# Patient Record
Sex: Female | Born: 1950 | Race: White | Hispanic: No | Marital: Married | Smoking: Never smoker
Health system: Southern US, Community
[De-identification: ages and names within clinical notes are randomized; demographics above are authoritative.]

## PROBLEM LIST (undated history)

## (undated) DIAGNOSIS — K219 Gastro-esophageal reflux disease without esophagitis: Secondary | ICD-10-CM

## (undated) DIAGNOSIS — E119 Type 2 diabetes mellitus without complications: Secondary | ICD-10-CM

## (undated) DIAGNOSIS — M792 Neuralgia and neuritis, unspecified: Secondary | ICD-10-CM

## (undated) DIAGNOSIS — E785 Hyperlipidemia, unspecified: Secondary | ICD-10-CM

## (undated) DIAGNOSIS — Z8719 Personal history of other diseases of the digestive system: Secondary | ICD-10-CM

## (undated) DIAGNOSIS — B029 Zoster without complications: Secondary | ICD-10-CM

## (undated) DIAGNOSIS — I10 Essential (primary) hypertension: Secondary | ICD-10-CM

## (undated) DIAGNOSIS — R0600 Dyspnea, unspecified: Secondary | ICD-10-CM

## (undated) DIAGNOSIS — M810 Age-related osteoporosis without current pathological fracture: Secondary | ICD-10-CM

## (undated) DIAGNOSIS — Z780 Asymptomatic menopausal state: Secondary | ICD-10-CM

## (undated) DIAGNOSIS — J309 Allergic rhinitis, unspecified: Secondary | ICD-10-CM

## (undated) HISTORY — DX: Asymptomatic menopausal state: Z78.0

## (undated) HISTORY — DX: Age-related osteoporosis without current pathological fracture: M81.0

## (undated) HISTORY — PX: TUBAL LIGATION: SHX77

## (undated) HISTORY — DX: Allergic rhinitis, unspecified: J30.9

## (undated) HISTORY — DX: Neuralgia and neuritis, unspecified: M79.2

## (undated) HISTORY — DX: Zoster without complications: B02.9

## (undated) HISTORY — DX: Gastro-esophageal reflux disease without esophagitis: K21.9

## (undated) HISTORY — DX: Hyperlipidemia, unspecified: E78.5

## (undated) HISTORY — PX: CHOLECYSTECTOMY: SHX55

---

## 2006-06-23 ENCOUNTER — Ambulatory Visit: Payer: Self-pay | Admitting: Unknown Physician Specialty

## 2008-10-19 ENCOUNTER — Ambulatory Visit: Payer: Self-pay | Admitting: Family Medicine

## 2009-10-16 ENCOUNTER — Ambulatory Visit: Payer: Self-pay | Admitting: Internal Medicine

## 2009-10-24 ENCOUNTER — Ambulatory Visit: Payer: Self-pay | Admitting: Internal Medicine

## 2009-11-24 ENCOUNTER — Ambulatory Visit: Payer: Self-pay | Admitting: General Surgery

## 2010-11-07 ENCOUNTER — Ambulatory Visit: Payer: Self-pay | Admitting: Family Medicine

## 2011-11-13 ENCOUNTER — Ambulatory Visit: Payer: Self-pay | Admitting: Internal Medicine

## 2011-11-19 LAB — PULMONARY FUNCTION TEST

## 2011-11-21 ENCOUNTER — Ambulatory Visit: Payer: Self-pay | Admitting: Family Medicine

## 2012-02-17 ENCOUNTER — Ambulatory Visit (INDEPENDENT_AMBULATORY_CARE_PROVIDER_SITE_OTHER): Payer: PRIVATE HEALTH INSURANCE | Admitting: Internal Medicine

## 2012-02-17 ENCOUNTER — Encounter: Payer: Self-pay | Admitting: Internal Medicine

## 2012-02-17 ENCOUNTER — Institutional Professional Consult (permissible substitution): Payer: Self-pay | Admitting: Internal Medicine

## 2012-02-17 VITALS — BP 130/86 | HR 87 | Temp 98.2°F | Ht 63.0 in | Wt 159.6 lb

## 2012-02-17 DIAGNOSIS — R0989 Other specified symptoms and signs involving the circulatory and respiratory systems: Secondary | ICD-10-CM

## 2012-02-17 DIAGNOSIS — J45909 Unspecified asthma, uncomplicated: Secondary | ICD-10-CM

## 2012-02-17 DIAGNOSIS — R05 Cough: Secondary | ICD-10-CM

## 2012-02-17 DIAGNOSIS — J449 Chronic obstructive pulmonary disease, unspecified: Secondary | ICD-10-CM

## 2012-02-17 DIAGNOSIS — R0600 Dyspnea, unspecified: Secondary | ICD-10-CM

## 2012-02-17 MED ORDER — BUDESONIDE-FORMOTEROL FUMARATE 160-4.5 MCG/ACT IN AERO: INHALATION_SPRAY | RESPIRATORY_TRACT | Status: DC

## 2012-02-17 MED ORDER — MONTELUKAST SODIUM 10 MG PO TABS: 10.0000 mg | ORAL_TABLET | Freq: Every day | ORAL | Status: DC

## 2012-02-17 NOTE — Patient Instructions (Addendum)
Symbicort 160 Take 2 puffs first thing in am and then another 2 puffs about 12 hours later.   Zergerid change it to take before bfast and add pepcid 20 mg one at bedtime  GERD (REFLUX)  is an extremely common cause of respiratory symptoms (looks just asthma) , many times with no significant heartburn at all.    It can be treated with medication, but also with lifestyle changes including avoidance of late meals, excessive alcohol, smoking cessation, and avoid fatty foods, chocolate, peppermint, colas, red wine, and acidic juices such as orange juice.  NO MINT OR MENTHOL PRODUCTS SO NO COUGH DROPS  USE SUGARLESS CANDY INSTEAD (jolley ranchers or Stover's)  NO OIL BASED VITAMINS - use powdered substitutes.    Work on inhaler technique:  relax and gently blow all the way out then take a nice smooth deep breath back in, triggering the inhaler at same time you start breathing in.  Hold for up to 5 seconds if you can.  Rinse and gargle with water when done   If your mouth or throat starts to bother you,   I suggest you time the inhaler to your dental care and after using the inhaler(s) brush teeth and tongue with a baking soda containing toothpaste and when you rinse this out, gargle with it first to see if this helps your mouth and throat.     Singulair 10 mg one each pm until you return  Stop spiriva and only take xyzal if needed for itching sneezing running nose  Please schedule a follow up office visit in 6 weeks, call sooner if needed with pft'ssdf

## 2012-02-17 NOTE — Progress Notes (Signed)
Subjective:    Patient ID: Ruth Grant, female    DOB: 03-10-1951   MRN: [id number]  HPI  80 yowf never smokers exposed as child with whooping cough but fine until around 2003 with daily symptoms of nasal drainage  and cough dx'd as copd by Dr Meredeth Ide and referred to pulmonary clinic 02/17/2012 by a pulmonary pt here = Annamaria Helling = HR Sherrine Maples Raven/sunbrella).   02/17/2012 1st pulmonary eval cc daily cough x years seems worse in Nov and Dec and better p use symbicort and spiriva (started both at same time) - cough is dry hacking,  Only sob with inclines.  singulair helped a lot, then quit working, shots helped some then quit working. ? If nasal symptoms better xyzal. hfa technique marginal.   No   purulent sputum or sinus/hb symptoms on present rx.  Sleeping ok without nocturnal  or early am exacerbation  of respiratory  c/o's or need for noct saba. Also denies any obvious fluctuation of symptoms with weather or environmental changes or other aggravating or alleviating factors except as outlined above       Review of Systems  Constitutional: Negative for fever, chills and unexpected weight change.  HENT: Negative for ear pain, nosebleeds, congestion, sore throat, rhinorrhea, sneezing, trouble swallowing, dental problem, voice change, postnasal drip and sinus pressure.   Eyes: Negative for visual disturbance.  Respiratory: Positive for cough and shortness of breath. Negative for choking.   Cardiovascular: Negative for chest pain and leg swelling.  Gastrointestinal: Negative for vomiting, abdominal pain and diarrhea.  Genitourinary: Negative for difficulty urinating.  Musculoskeletal: Negative for arthralgias.  Skin: Negative for rash.  Neurological: Negative for tremors, syncope and headaches.  Hematological: Does not bruise/bleed easily.       Objective:   Physical Exam Pleasant amb wf nad 159 02/17/12 HEENT mild turbinate edema.  Oropharynx no thrush or excess pnd or  cobblestoning.  No JVD or cervical adenopathy. no accessory muscle hypertrophy. Trachea midline, nl thryroid. Chest was min hyperinflated by percussion with slt diminished breath sounds and mild increased exp time without wheeze. Hoover sign positive at very end inspiration. Regular rate and rhythm without murmur gallop or rub or increase P2 or edema.  Abd: no hsm, nl excursion. Ext warm without cyanosis or clubbing.         Assessment & Plan:   Outpatient Encounter Prescriptions as of 02/17/2012  Medication Sig Dispense Refill  . aspirin 81 MG tablet Take 81 mg by mouth daily.      . budesonide-formoterol (SYMBICORT) 160-4.5 MCG/ACT inhaler Take 2 puffs first thing in am and then another 2 puffs about 12 hours later.  1 Inhaler    . cholecalciferol (VITAMIN D) 1000 UNITS tablet Take 1,000 Units by mouth daily.      Marland Kitchen levocetirizine (XYZAL) 5 MG tablet Take 5 mg by mouth every evening.      Marland Kitchen omeprazole-sodium bicarbonate (ZEGERID) 40-1100 MG per capsule Take 30-60 min before first meal of the day      . raloxifene (EVISTA) 60 MG tablet Take 60 mg by mouth daily.      . sertraline (ZOLOFT) 50 MG tablet Take 50 mg by mouth daily.      . simvastatin (ZOCOR) 40 MG tablet Take 40 mg by mouth at bedtime.      Marland Kitchen DISCONTD: budesonide-formoterol (SYMBICORT) 160-4.5 MCG/ACT inhaler Inhale 2 puffs into the lungs daily.      Marland Kitchen DISCONTD: omeprazole (PRILOSEC) 20 MG capsule Take 20  mg by mouth daily. Delayed release      . DISCONTD: omeprazole-sodium bicarbonate (ZEGERID) 40-1100 MG per capsule Take 1 capsule by mouth at bedtime.      Marland Kitchen DISCONTD: tiotropium (SPIRIVA) 18 MCG inhalation capsule Place 18 mcg into inhaler and inhale daily.      . famotidine (PEPCID) 20 MG tablet One at bedtime      . montelukast (SINGULAIR) 10 MG tablet Take 1 tablet (10 mg total) by mouth at bedtime.  30 tablet  11

## 2012-02-18 DIAGNOSIS — R05 Cough: Secondary | ICD-10-CM | POA: Insufficient documentation

## 2012-02-18 MED ORDER — FAMOTIDINE 20 MG PO TABS: ORAL_TABLET | ORAL | Status: DC

## 2012-02-18 MED ORDER — OMEPRAZOLE-SODIUM BICARBONATE 40-1100 MG PO CAPS: ORAL_CAPSULE | ORAL | Status: DC

## 2012-02-18 NOTE — Assessment & Plan Note (Signed)
The most common causes of chronic cough in immunocompetent adults include the following: upper airway cough syndrome (UACS), previously referred to as postnasal drip syndrome (PNDS), which is caused by variety of rhinosinus conditions; (2) asthma; (3) GERD; (4) chronic bronchitis from cigarette smoking or other inhaled environmental irritants; (5) nonasthmatic eosinophilic bronchitis; and (6) bronchiectasis.   These conditions, singly or in combination, have accounted for up to 94% of the causes of chronic cough in prospective studies.   Other conditions have constituted no >6% of the causes in prospective studies These have included bronchogenic carcinoma, chronic interstitial pneumonia, sarcoidosis, left ventricular failure, ACEI-induced cough, and aspiration from a condition associated with pharyngeal dysfunction.   Not clear whether this cough is a pulmonary problem or  Classic Upper airway cough syndrome, so named because it's frequently impossible to sort out how much is  CR/sinusitis with freq throat clearing (which can be related to primary GERD)   vs  causing  secondary (" extra esophageal")  GERD from wide swings in gastric pressure that occur with throat clearing, often  promoting self use of mint and menthol lozenges that reduce the lower esophageal sphincter tone and exacerbate the problem further in a cyclical fashion.   These are the same pts (now being labeled as having "irritable larynx syndrome" by some cough centers) who not infrequently have a history of having failed to tolerate ace inhibitors,  dry powder inhalers or biphosphonates or report having atypical reflux symptoms that don't respond to standard doses of PPI , and are easily confused as having aecopd or asthma flares by even experienced allergists/ pulmonologists.   For now, max GERD RX with q am zegerid and qhs Pepcid then regroup.  Also try off spiriva which is DPI and can aggravate upper airway cough.

## 2012-02-18 NOTE — Assessment & Plan Note (Addendum)
-   HFA 75% p coaching 02/17/2012     -Alpha one AT screening MM per Dr Meredeth Ide  2013    - PFTs 11/19/11 FEV1 1.24 (53%) ratio 53 and 16% better p B2,  DLCO 56 > corrects to 112  Severe chronic asthma is not the same as copd and we may be able to offer a different (non-copd) approach to address her chronic asthma symtpoms more effectively.  However, I explained as clearly as I could: Unlike when you get a prescription for eyeglasses, it's not possible to always walk out of this or any medical office with a perfect prescription that is immediately effective  based on any test that we offer here.    On the contrary, it may take several weeks for the full impact of changes recommened today - hopefully you will respond well.  If not, then we'll adjust your medication on your next visit accordingly, knowing more then than we can possibly know now.      For now try maximize hfa technique, rechallenge with singulair and regroup with full repeat set of pfts  The proper method of use, as well as anticipated side effects, of a metered-dose inhaler are discussed and demonstrated to the patient. Improved effectiveness after extensive coaching during this visit to a level of approximately  75%

## 2012-03-31 ENCOUNTER — Ambulatory Visit: Payer: PRIVATE HEALTH INSURANCE | Admitting: Internal Medicine

## 2012-04-15 ENCOUNTER — Encounter (INDEPENDENT_AMBULATORY_CARE_PROVIDER_SITE_OTHER): Payer: PRIVATE HEALTH INSURANCE

## 2012-04-15 ENCOUNTER — Encounter: Payer: Self-pay | Admitting: Internal Medicine

## 2012-04-15 ENCOUNTER — Ambulatory Visit (INDEPENDENT_AMBULATORY_CARE_PROVIDER_SITE_OTHER): Payer: PRIVATE HEALTH INSURANCE | Admitting: Internal Medicine

## 2012-04-15 VITALS — BP 122/78 | HR 92 | Temp 98.6°F | Ht 63.0 in | Wt 169.0 lb

## 2012-04-15 DIAGNOSIS — J45909 Unspecified asthma, uncomplicated: Secondary | ICD-10-CM

## 2012-04-15 DIAGNOSIS — R0602 Shortness of breath: Secondary | ICD-10-CM

## 2012-04-15 DIAGNOSIS — J449 Chronic obstructive pulmonary disease, unspecified: Secondary | ICD-10-CM

## 2012-04-15 DIAGNOSIS — R05 Cough: Secondary | ICD-10-CM

## 2012-04-15 NOTE — Patient Instructions (Addendum)
Work on Mining engineer inhaler technique:  relax and gently blow all the way out then take a nice smooth deep breath back in, triggering the inhaler at same time you start breathing in.  Hold for up to 5 seconds if you can.  Rinse and gargle with water when done   If your mouth or throat starts to bother you,   I suggest you time the inhaler to your dental care and after using the inhaler(s) brush teeth and tongue with a baking soda containing toothpaste and when you rinse this out, gargle with it first to see if this helps your mouth and throat.     Ok to leave off singulair to see if any coughing, sinus or breathing problems develop and if so start it back right away  Please schedule a follow up visit in 3 months but call sooner if needed - Late add :  Needs cxr next ov

## 2012-04-15 NOTE — Progress Notes (Signed)
  Subjective:    Patient ID: Ruth Grant, female    DOB: [birth year]   MRN: [id number]  HPI  8 yowf never smokers exposed as child with whooping cough but fine until around 2003 with daily symptoms of nasal drainage  and cough dx'd as copd by Dr Meredeth Ide and referred to pulmonary clinic 02/17/2012 by a pulmonary pt here = Ruth Grant = Ruth Grant/Ruth Grant).   02/17/2012 1st pulmonary eval cc daily cough x years seems worse in Nov and Dec and better p use symbicort and spiriva (started both at same time) - cough is dry hacking,  Only sob with inclines.  singulair helped a lot, then quit working, shots helped some then quit working. ? If nasal symptoms better xyzal. hfa technique marginal.   No   purulent sputum or sinus/hb symptoms on present rx. rec Symbicort 160 Take 2 puffs first thing in am and then another 2 puffs about 12 hours later.  Zergerid change it to take before bfast and add pepcid 20 mg one at bedtime GERD diet Work on inhaler technique:      Singulair 10 mg one each pm until you return Stop spiriva and only take xyzal if needed for itching sneezing running nose Please schedule a follow up office visit in 6 weeks, call sooner if needed with pft's  04/15/2012 f/u ov/Danean Marner cc much better overall than baseline min cough, not really limited by breathing. No obvious daytime variabilty or assoc   cp or chest tightness, subjective wheeze overt sinus or hb symptoms. No unusual exp hx or h/o childhood pna/ asthma or premature birth to his knowledge.   Sleeping ok without nocturnal  or early am exacerbation  of respiratory  c/o's or need for noct saba. Also denies any obvious fluctuation of symptoms with weather or environmental changes or other aggravating or alleviating factors except as outlined above   ROS  The following are not active complaints unless bolded sore throat, dysphagia, dental problems, itching, sneezing,  nasal congestion or excess/ purulent secretions, ear ache,    fever, chills, sweats, unintended wt loss, pleuritic or exertional cp, hemoptysis,  orthopnea pnd or leg swelling, presyncope, palpitations, heartburn, abdominal pain, anorexia, nausea, vomiting, diarrhea  or change in bowel or urinary habits, change in stools or urine, dysuria,hematuria,  rash, arthralgias, visual complaints, headache, numbness weakness or ataxia or problems with walking or coordination,  change in mood/affect or memory.               Objective:   Physical Exam Pleasant amb wf nad 159 02/17/12 > 04/15/2012  169 HEENT mild turbinate edema.  Oropharynx no thrush or excess pnd or cobblestoning.  No JVD or cervical adenopathy. no accessory muscle hypertrophy. Trachea midline, nl thryroid. Chest was min hyperinflated by percussion with slt diminished breath sounds and mild increased exp time without wheeze. Hoover sign positive at very end inspiration. Regular rate and rhythm without murmur gallop or rub or increase P2 or edema.  Abd: no hsm, nl excursion. Ext warm without cyanosis or clubbing.         Assessment & Plan:

## 2012-04-16 NOTE — Assessment & Plan Note (Addendum)
-   HFA 75% p coaching 04/15/12    -Alpha one AT screening MM per Dr Meredeth Ide  2013    - PFTs 11/19/11 FEV1 1.24 (53%) ratio 53 and 16% better p B2,  DLCO 56 > corrects to 112    - PFT's 04/15/12 FEV1 1.45 (67%) ratio 58 and no change p B2, DLC0 63 corrects to 112  This is clearly not typical copd: better off spiriva in a never smoker with improving fev1 despite suboptimal hfa even p extensive coaching  The proper method of use, as well as anticipated side effects, of a metered-dose inhaler are discussed and demonstrated to the patient. Improved effectiveness after extensive coaching during this visit to a level of approximately  75%  For now try "monotherapy" with symbicort 160 2bid and leave off singulair to see if flares  Needs cxr next ov

## 2012-04-16 NOTE — Assessment & Plan Note (Signed)
-   improved off spiriva and on gerd rx 04/15/12 > continue

## 2012-07-21 ENCOUNTER — Ambulatory Visit: Payer: PRIVATE HEALTH INSURANCE | Admitting: Internal Medicine

## 2012-07-24 ENCOUNTER — Ambulatory Visit (INDEPENDENT_AMBULATORY_CARE_PROVIDER_SITE_OTHER): Payer: PRIVATE HEALTH INSURANCE | Admitting: Internal Medicine

## 2012-07-24 ENCOUNTER — Encounter: Payer: Self-pay | Admitting: Internal Medicine

## 2012-07-24 ENCOUNTER — Ambulatory Visit (INDEPENDENT_AMBULATORY_CARE_PROVIDER_SITE_OTHER)
Admission: RE | Admit: 2012-07-24 | Discharge: 2012-07-24 | Disposition: A | Discharge status: Home / Self Care | Payer: PRIVATE HEALTH INSURANCE | Source: Ambulatory Visit | Attending: Internal Medicine | Admitting: Internal Medicine

## 2012-07-24 VITALS — BP 132/80 | HR 91 | Temp 97.9°F | Ht 63.0 in | Wt 163.0 lb

## 2012-07-24 DIAGNOSIS — J45909 Unspecified asthma, uncomplicated: Secondary | ICD-10-CM

## 2012-07-24 DIAGNOSIS — J4489 Other specified chronic obstructive pulmonary disease: Secondary | ICD-10-CM

## 2012-07-24 DIAGNOSIS — J449 Chronic obstructive pulmonary disease, unspecified: Secondary | ICD-10-CM

## 2012-07-24 MED ORDER — BUDESONIDE-FORMOTEROL FUMARATE 160-4.5 MCG/ACT IN AERO: INHALATION_SPRAY | RESPIRATORY_TRACT | Status: DC

## 2012-07-24 NOTE — Patient Instructions (Addendum)
Work on inhaler technique:  relax and gently blow all the way out then take a nice smooth deep breath back in, triggering the inhaler at same time you start breathing in.  Hold for up to 5 seconds if you can.  Rinse and gargle with water when done   If your mouth or throat starts to bother you,   I suggest you time the inhaler to your dental care and after using the inhaler(s) brush teeth and tongue with a baking soda containing toothpaste and when you rinse this out, gargle with it first to see if this helps your mouth and throat.     Please schedule a follow up visit in 3 months but call sooner if needed for pfts

## 2012-07-24 NOTE — Progress Notes (Signed)
Subjective:    Patient ID: Ruth Grant, female    DOB: 30-Aug-1950   MRN: [id number]  HPI  7 yowf never smoker  exposed as child with whooping cough but fine until around 2003 with daily symptoms of nasal drainage  and cough dx'd as copd by Dr Meredeth Ide and referred to pulmonary clinic 02/17/2012 by a pulmonary pt here = Annamaria Helling = HR Sherrine Maples Raven/sunbrella).   02/17/2012 1st pulmonary eval cc daily cough x years seems worse in Nov and Dec and better p use symbicort and spiriva (started both at same time) - cough is dry hacking,  Only sob with inclines.  singulair helped a lot, then quit working, shots helped some then quit working. ? If nasal symptoms better xyzal. hfa technique marginal.   No   purulent sputum or sinus/hb symptoms on present rx. rec Symbicort 160 Take 2 puffs first thing in am and then another 2 puffs about 12 hours later.  Zergerid change it to take before bfast and add pepcid 20 mg one at bedtime GERD diet Work on inhaler technique:      Singulair 10 mg one each pm until you return Stop spiriva and only take xyzal if needed for itching sneezing running nose Please schedule a follow up office visit in 6 weeks, call sooner if needed with pft's  04/15/2012 f/u ov/Richie Bonanno cc much better overall than baseline min cough, not really limited by breathing. rec Work on Mining engineer inhaler technique:    Ok to leave off singulair to see if any coughing, sinus or breathing problems develop and if so start it back right away Please schedule a follow up visit in 3 months but call sooner if needed    07/24/2012 f/u ov/Ryun Velez cc overall better, nasal drainage issues p  3 weeks off so restarted singulair - no cough or limiting sob   No obvious daytime variabilty or assoc   cp or chest tightness, subjective wheeze overt sinus or hb symptoms. No unusual exp hx or h/o childhood pna/ asthma or premature birth to his knowledge.   Sleeping ok without nocturnal  or early am exacerbation   of respiratory  c/o's or need for noct saba. Also denies any obvious fluctuation of symptoms with weather or environmental changes or other aggravating or alleviating factors except as outlined above   ROS  The following are not active complaints unless bolded sore throat, dysphagia, dental problems, itching, sneezing,  nasal congestion or excess/ purulent secretions, ear ache,   fever, chills, sweats, unintended wt loss, pleuritic or exertional cp, hemoptysis,  orthopnea pnd or leg swelling, presyncope, palpitations, heartburn, abdominal pain, anorexia, nausea, vomiting, diarrhea  or change in bowel or urinary habits, change in stools or urine, dysuria,hematuria,  rash, arthralgias, visual complaints, headache, numbness weakness or ataxia or problems with walking or coordination,  change in mood/affect or memory.              Objective:   Physical Exam Pleasant amb wf nad 159 02/17/12 > 04/15/2012  169> 163 07/25/2012  HEENT mild turbinate edema.  Oropharynx no thrush or excess pnd or cobblestoning.  No JVD or cervical adenopathy. no accessory muscle hypertrophy. Trachea midline, nl thryroid. Chest was min hyperinflated by percussion with slt diminished breath sounds and mild increased exp time without wheeze. Hoover sign positive at very end inspiration. Regular rate and rhythm without murmur gallop or rub or increase P2 or edema.  Abd: no hsm, nl excursion. Ext warm without cyanosis or  clubbing.     CXR  07/24/2012 :  No active cardiopulmonary disease.     Assessment & Plan:

## 2012-07-25 NOTE — Assessment & Plan Note (Addendum)
-   HFA 75% p coaching 04/15/12    -Alpha one AT screening MM per Dr Meredeth Ide  2013    - PFTs 11/19/11 FEV1 1.24 (53%) ratio 53 and 16% better p B2,  DLCO 56 > corrects to 112    - PFT's 04/15/12 FEV1 1.45 (67%) ratio 58 and no change p B2, DLC0 63 corrects to 112  C/w severe chronic asthma,  Gradually improving.  All goals of chronic asthma control met including optimal (athough perhaps not yet nl) function and elimination of symptoms with minimal need for rescue therapy.  Contingencies discussed in full including contacting this office immediately if not controlling the symptoms using the rule of two's.     The proper method of use, as well as anticipated side effects, of a metered-dose inhaler are discussed and demonstrated to the patient. Improved effectiveness after extensive coaching during this visit to a level of approximately  75%

## 2012-09-03 ENCOUNTER — Telehealth: Payer: Self-pay | Admitting: Internal Medicine

## 2012-09-03 NOTE — Telephone Encounter (Signed)
Pt returned call, she c/p prod cough with clear mucus, "a little bit" of wheezing and PND.  Was given zpak by PCP - has not helped.  Ov w/ TP tomorrow @ 1115.  Pt okay with this date and time.  Nothing further needed; will sign off.

## 2012-09-03 NOTE — Telephone Encounter (Signed)
lmomtcb x1 

## 2012-09-04 ENCOUNTER — Ambulatory Visit (INDEPENDENT_AMBULATORY_CARE_PROVIDER_SITE_OTHER): Payer: BC Managed Care – PPO | Admitting: Adult Health

## 2012-09-04 ENCOUNTER — Encounter: Payer: Self-pay | Admitting: Adult Health

## 2012-09-04 VITALS — BP 140/72 | HR 88 | Temp 98.6°F | Ht 63.0 in | Wt 165.0 lb

## 2012-09-04 DIAGNOSIS — J45909 Unspecified asthma, uncomplicated: Secondary | ICD-10-CM

## 2012-09-04 MED ORDER — PREDNISONE 10 MG PO TABS: ORAL_TABLET | ORAL | Status: DC

## 2012-09-04 MED ORDER — HYDROCODONE-HOMATROPINE 5-1.5 MG/5ML PO SYRP: 5.0000 mL | ORAL_SOLUTION | Freq: Four times a day (QID) | ORAL | Status: AC | PRN

## 2012-09-04 MED ORDER — LEVALBUTEROL HCL 0.63 MG/3ML IN NEBU
0.6300 mg | INHALATION_SOLUTION | Freq: Once | RESPIRATORY_TRACT | Status: AC
  Administered 2012-09-04: 0.63 mg via RESPIRATORY_TRACT

## 2012-09-04 NOTE — Addendum Note (Signed)
Addended by: Boone Master E on: 09/04/2012 12:58 PM   Modules accepted: Orders

## 2012-09-04 NOTE — Patient Instructions (Addendum)
Prednisone taper over next week  Delsym 2 tsp Twice daily  As needed  Cough  Hydromet 1-2 tsp every 4-6 hr , As needed  Cough - may make you sleepy  Please contact office for sooner follow up if symptoms do not improve or worsen or seek emergency care  follow up Dr. Sherene Sires  As planned .

## 2012-09-04 NOTE — Assessment & Plan Note (Addendum)
Flare with URI  xopenex neb x 1 in office   Plan  Prednisone taper over next week  Delsym 2 tsp Twice daily  As needed  Cough  Hydromet 1-2 tsp every 4-6 hr , As needed  Cough - may make you sleepy  Please contact office for sooner follow up if symptoms do not improve or worsen or seek emergency care  follow up Dr. Sherene Sires  As planned .

## 2012-09-04 NOTE — Progress Notes (Signed)
Subjective:    Patient ID: Ruth Grant, female    DOB: 07-31-1951   MRN: [id number]  HPI  50 yowf never smoker  exposed as child with whooping cough but fine until around 2003 with daily symptoms of nasal drainage  and cough dx'd as copd by Dr Meredeth Ide and referred to pulmonary clinic 02/17/2012 by a pulmonary pt here = Ruth Grant = Ruth Grant).   02/17/2012 1st pulmonary eval cc daily cough x years seems worse in Nov and Dec and better p use symbicort and spiriva (started both at same time) - cough is dry hacking,  Only sob with inclines.  singulair helped a lot, then quit working, shots helped some then quit working. ? If nasal symptoms better xyzal. hfa technique marginal.   No   purulent sputum or sinus/hb symptoms on present rx. rec Symbicort 160 Take 2 puffs first thing in am and then another 2 puffs about 12 hours later.  Zergerid change it to take before bfast and add pepcid 20 mg one at bedtime GERD diet Work on inhaler technique:      Singulair 10 mg one each pm until you return Stop spiriva and only take xyzal if needed for itching sneezing running nose Please schedule a follow up office visit in 6 weeks, call sooner if needed with pft's  04/15/2012 f/u ov/Wert cc much better overall than baseline min cough, not really limited by breathing. rec Work on Mining engineer inhaler technique:    Ok to leave off singulair to see if any coughing, sinus or breathing problems develop and if so start it back right away Please schedule a follow up visit in 3 months but call sooner if needed    07/24/2012 f/u ov/Wert cc overall better, nasal drainage issues p  3 weeks off so restarted singulair - no cough or limiting sob >>no changes   09/04/2012 Acute OV  Complains of ongoing cough for 1 month with occasional  Prod cough with clear mucus.  Occasional wheezing and drainage. Rochele Pages Zpak 3 weeks ago -   Denies sob or chest tightness or fever No fever, chest pain or  edema.  Taking nyquil without much help.  Cough is keeping her up at night.    ROS Constitutional:   No  weight loss, night sweats,  Fevers, chills, + fatigue, or  lassitude.  HEENT:   No headaches,  Difficulty swallowing,  Tooth/dental problems, or  Sore throat,                No sneezing, itching, ear ache, + nasal congestion, post nasal drip,   CV:  No chest pain,  Orthopnea, PND, swelling in lower extremities, anasarca, dizziness, palpitations, syncope.   GI  No heartburn, indigestion, abdominal pain, nausea, vomiting, diarrhea, change in bowel habits, loss of appetite, bloody stools.   Resp:  No coughing up of blood.   No chest wall deformity  Skin: no rash or lesions.  GU: no dysuria, change in color of urine, no urgency or frequency.  No flank pain, no hematuria   MS:  No joint pain or swelling.  No decreased range of motion.  No back pain.  Psych:  No change in mood or affect. No depression or anxiety.  No memory loss.                      Objective:   Physical Exam Pleasant amb wf nad 159 02/17/12 > 04/15/2012  169>  163 07/25/2012 >165 09/04/2012  GEN: A/Ox3; pleasant , NAD, well nourished   HEENT:  [place]/AT,  EACs-clear, TMs-wnl, NOSE-clear drainage , THROAT-clear, no lesions, no postnasal drip or exudate noted.   NECK:  Supple w/ fair ROM; no JVD; normal carotid impulses w/o bruits; no thyromegaly or nodules palpated; no lymphadenopathy.  RESP  Clear  P & A; w/o, wheezes/ rales/ or rhonchi.no accessory muscle use, no dullness to percussion  CARD:  RRR, no m/r/g  , no peripheral edema, pulses intact, no cyanosis or clubbing.  GI:   Soft & nt; nml bowel sounds; no organomegaly or masses detected.  Musco: Warm bil, no deformities or joint swelling noted.   Neuro: alert, no focal deficits noted.    Skin: Warm, no lesions or rashes    CXR  07/24/2012 :  No active cardiopulmonary disease.     Assessment & Plan:

## 2012-09-10 ENCOUNTER — Telehealth: Payer: Self-pay | Admitting: Internal Medicine

## 2012-09-10 MED ORDER — AZITHROMYCIN 250 MG PO TABS: ORAL_TABLET | ORAL | Status: DC

## 2012-09-10 NOTE — Telephone Encounter (Signed)
Spoke with patient. Patient c/o head congestion, PND, prod cough with yellow mucus, HA and sinus pressure x 1 day. Pt denies fever. Patient is requesting something be called into  [facility] [address] [place], [place]  Dr Sherene Sires please advise. Thanks

## 2012-09-10 NOTE — Telephone Encounter (Signed)
Pt aware of MW recs. He voiced his understanding and rx has been sent. Nothing further was needed

## 2012-09-10 NOTE — Telephone Encounter (Signed)
zpak # [phone]

## 2012-09-29 ENCOUNTER — Telehealth: Payer: Self-pay | Admitting: Internal Medicine

## 2012-09-29 NOTE — Telephone Encounter (Signed)
Spoke with the pt and scheduled appt with MW for tomorrow at 3 pm  She was c/o having prod cough with green sputum I advised to call sooner if feels cant wait or seek emergent care if needed

## 2012-09-30 ENCOUNTER — Encounter: Payer: Self-pay | Admitting: Internal Medicine

## 2012-09-30 ENCOUNTER — Ambulatory Visit (INDEPENDENT_AMBULATORY_CARE_PROVIDER_SITE_OTHER): Payer: BC Managed Care – PPO | Admitting: Internal Medicine

## 2012-09-30 VITALS — BP 122/72 | HR 117 | Temp 98.8°F | Ht 63.0 in | Wt 162.6 lb

## 2012-09-30 DIAGNOSIS — J45909 Unspecified asthma, uncomplicated: Secondary | ICD-10-CM

## 2012-09-30 MED ORDER — PREDNISONE (PAK) 10 MG PO TABS: ORAL_TABLET | ORAL | Status: DC

## 2012-09-30 MED ORDER — AMOXICILLIN-POT CLAVULANATE 875-125 MG PO TABS: 1.0000 | ORAL_TABLET | Freq: Two times a day (BID) | ORAL | Status: DC

## 2012-09-30 MED ORDER — TRAMADOL HCL 50 MG PO TABS: ORAL_TABLET | ORAL | Status: DC

## 2012-09-30 NOTE — Progress Notes (Signed)
Subjective:    Patient ID: Ruth Grant, female    DOB: 1950-10-29   MRN: [id number]  HPI  78 yowf never smoker  exposed as child with whooping cough but fine until around 2003 with daily symptoms of nasal drainage  and cough dx'd as copd by Dr Meredeth Ide and referred to pulmonary clinic 02/17/2012 by a pulmonary pt here = Annamaria Helling = HR Sherrine Maples Raven/sunbrella).   02/17/2012 1st pulmonary eval cc daily cough x years seems worse in Nov and Dec and better p use symbicort and spiriva (started both at same time) - cough is dry hacking,  Only sob with inclines.  singulair helped a lot, then quit working, shots helped some then quit working. ? If nasal symptoms better xyzal. hfa technique marginal.   No   purulent sputum or sinus/hb symptoms on present rx. rec Symbicort 160 Take 2 puffs first thing in am and then another 2 puffs about 12 hours later.  Zergerid change it to take before bfast and add pepcid 20 mg one at bedtime GERD diet Work on inhaler technique:      Singulair 10 mg one each pm until you return Stop spiriva and only take xyzal if needed for itching sneezing running nose Please schedule a follow up office visit in 6 weeks, call sooner if needed with pft's  04/15/2012 f/u ov/Dequavius Kuhner cc much better overall than baseline min cough, not really limited by breathing. 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  Occasional wheezing and drainage. Rochele Pages Zpak 3 weeks   rec Prednisone taper over  next week  Delsym 2 tsp Twice daily  As needed  Cough  Hydromet 1-2 tsp every 4-6 hr , As needed  Cough - may make you sleepy    09/30/2012 f/u ov/Ezella Kell cc transiently better on prednisone only then worse much worse with temp changes or when lie down with cc green mucus production and body aches, low grade fever, doe but not at rest  No obvious daytime variabilty or assoc   cp or chest tightness, subjective wheeze overt sinus or hb symptoms. No unusual exp hx or h/o childhood pna/ asthma or premature birth to her knowledge.     Also denies any obvious fluctuation of symptoms with weather or environmental changes or other aggravating or alleviating factors except as outlined above   ROS  The following are not active complaints unless bolded sore throat, dysphagia, dental problems, itching, sneezing,  nasal congestion or excess/ purulent secretions, ear ache,   fever, chills, sweats, unintended wt loss, pleuritic or exertional cp, hemoptysis,  orthopnea pnd or leg swelling, presyncope, palpitations, heartburn, abdominal pain, anorexia, nausea, vomiting, diarrhea  or change in bowel or urinary habits, change in stools or urine, dysuria,hematuria,  rash, arthralgias, visual complaints, headache, numbness weakness or ataxia or problems with walking or coordination,  change in mood/affect or memory.           Marland Kitchen  Objective:   Physical Exam Pleasant amb wf nad 159 02/17/12 > 04/15/2012  169> 163 07/25/2012 >165 09/04/2012 >  162 09/30/2012  GEN: A/Ox3; pleasant , NAD, well nourished   HEENT:  [place]/AT,  EACs-clear, TMs-wnl, NOSE-clear drainage , THROAT-clear, no lesions, no postnasal drip or exudate noted.   NECK:  Supple w/ fair ROM; no JVD; normal carotid impulses w/o bruits; no thyromegaly or nodules palpated; no lymphadenopathy.  RESP  Clear  P & A; w/o, wheezes/ rales/ or rhonchi.no accessory muscle use, no dullness to percussion  CARD:  RRR, no m/r/g  , no  peripheral edema, pulses intact, no cyanosis or clubbing.  GI:   Soft & nt; nml bowel sounds; no organomegaly or masses detected.  Musco: Warm bil, no deformities or joint swelling noted.   Neuro: alert, no focal deficits noted.    Skin: Warm, no lesions or rashes    CXR  07/24/2012 :  No active cardiopulmonary disease.     Assessment & Plan:

## 2012-09-30 NOTE — Patient Instructions (Addendum)
Augmentin 875 mg twice daily x 10 days and call [facility] at [phone] and ask for sinus ct   Prednisone 10 mg take  4 each am x 2 days,   2 each am x 2 days,  1 each am x2days and stop   Work on inhaler technique:  relax and gently blow all the way out then take a nice smooth deep breath back in, triggering the inhaler at same time you start breathing in.  Hold for up to 5 seconds if you can.  Rinse and gargle with water when done   If your mouth or throat starts to bother you,   I suggest you time the inhaler to your dental care and after using the inhaler(s) brush teeth and tongue with a baking soda containing toothpaste and when you rinse this out, gargle with it first to see if this helps your mouth and throat.     Take delsym two tsp every 12 hours and supplement if needed with  tramadol 50 mg up to 2 every 4 hours to suppress the urge to cough. Swallowing water or using ice chips/non mint and menthol containing candies (such as lifesavers or sugarless jolly ranchers) are also effective.  You should rest your voice and avoid activities that you know make you cough.  Once you have eliminated the cough for 3 straight days try reducing the tramadol first,  then the delsym as tolerated.    Omeprazole 20 mg Take 30- 60 min before your first and last meals of the day until cough is gone  Please schedule a follow up office visit in 6 weeks, call sooner if needed

## 2012-10-04 NOTE — Assessment & Plan Note (Signed)
-   HFA 75% p coaching 04/15/12    -Alpha one AT screening MM per Dr Meredeth Ide  2013    - PFTs 11/19/11 FEV1 1.24 (53%) ratio 53 and 16% better p B2,  DLCO 56 > corrects to 112    - PFT's 04/15/12 FEV1 1.45 (67%) ratio 58 and no change p B2, DLC0 63 corrects to 112  DDX of  difficult airways managment all start with A and  include Adherence, Ace Inhibitors, Acid Reflux, Active Sinus Disease, Alpha 1 Antitripsin deficiency, Anxiety masquerading as Airways dz,  ABPA,  allergy(esp in young), Aspiration (esp in elderly), Adverse effects of DPI,  Active smokers, plus two Bs  = Bronchiectasis and Beta blocker use..and one C= CHF   Adherence is always the initial "prime suspect" and is a multilayered concern that requires a "trust but verify" approach in every patient - starting with knowing how to use medications, especially inhalers, correctly, keeping up with refills and understanding the fundamental difference between maintenance and prns vs those medications only taken for a very short course and then stopped and not refilled.   ? Acid reflux > rx  ? Acute/ active sinus dz > rx x 10 days augmentin then sinus ct if not better    Each maintenance medication was reviewed in detail including most importantly the difference between maintenance and as needed and under what circumstances the prns are to be used.  Please see instructions for details which were reviewed in writing and the patient given a copy.

## 2012-11-24 ENCOUNTER — Ambulatory Visit: Payer: Self-pay | Admitting: Internal Medicine

## 2013-12-02 ENCOUNTER — Emergency Department: Payer: Self-pay | Admitting: Internal Medicine

## 2013-12-13 DIAGNOSIS — M810 Age-related osteoporosis without current pathological fracture: Secondary | ICD-10-CM | POA: Insufficient documentation

## 2013-12-30 ENCOUNTER — Ambulatory Visit: Payer: Self-pay | Admitting: Family Medicine

## 2014-11-30 ENCOUNTER — Ambulatory Visit
Admit: 2014-11-30 | Disposition: A | Discharge status: Home / Self Care | Payer: Self-pay | Attending: Internal Medicine | Admitting: Internal Medicine

## 2015-03-07 ENCOUNTER — Encounter: Payer: Self-pay | Admitting: Podiatry

## 2015-03-07 ENCOUNTER — Ambulatory Visit (INDEPENDENT_AMBULATORY_CARE_PROVIDER_SITE_OTHER): Payer: BLUE CROSS/BLUE SHIELD

## 2015-03-07 ENCOUNTER — Ambulatory Visit (INDEPENDENT_AMBULATORY_CARE_PROVIDER_SITE_OTHER): Payer: BLUE CROSS/BLUE SHIELD | Admitting: Podiatry

## 2015-03-07 VITALS — BP 159/96 | HR 88 | Resp 16 | Ht 63.0 in | Wt 160.0 lb

## 2015-03-07 DIAGNOSIS — D3610 Benign neoplasm of peripheral nerves and autonomic nervous system, unspecified: Secondary | ICD-10-CM

## 2015-03-07 MED ORDER — TRIAMCINOLONE ACETONIDE 10 MG/ML IJ SUSP
10.0000 mg | Freq: Once | INTRAMUSCULAR | Status: AC
  Administered 2015-03-07: 10 mg

## 2015-03-09 NOTE — Progress Notes (Signed)
Patient ID: Ruth Grant, female   DOB: 1950-11-20, 64 y.o.   MRN: [id number]  Subjective: 64 year old female presents the office today for concern the right third and fourth toe numbness which has been ongoing for several years however over the last 1 year and has been worsening in symptoms. She states that previously she did have injections to the area to "kill the nerve" which seem to help with that time. However the last few the symptoms started to recur. She states of the third and fourth toes are numb intermittently and she feels some nerve pain to the toes. She also states that when she gets up she feels that there is a mass in between her toes. She states that this feels the same as it did previously. Denies any history of injury or trauma denies any swelling or redness. No other complaints at this time.  Objective: AAO x3, NAD DP/PT pulses palpable bilaterally, CRT less than 3 seconds Protective sensation intact with Simms Weinstein monofilament, vibratory sensation intact, Achilles tendon reflex intact There is tenderness palpation of the third interspace and the small neuroma is palpable. There is clicking sensation present. Upon compression there does appear to be reproduction of symptoms of numbness to the third and fourth toes. There is no area pinpoint bony tenderness or pain in vibratory sensation. No areas of tenderness to bilateral lower extremities. MMT 5/5, ROM WNL.  No open lesions or pre-ulcerative lesions.  No overlying edema, erythema, increase in warmth to bilateral lower extremities.  No pain with calf compression, swelling, warmth, erythema bilaterally.   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  -Treatment options discussed including all alternatives, risks, and complications -I discussed steroid injection the area to see if this will help alleviate her symptoms. Also  discussed possible restarting of alcohol sclerosing injections. At this time shows to proceed with a steroid injection to the area. I discussed risks and complications for which she understands and verbally consents. Under sterile conditions a total of 1.5 mL of a mixture Kenalog 10, 0.5% Marcaine plain and 2% lidocaine plain was infiltrated into the area of maximal tenderness in the right third interspace. A Band-Aid was applied. She tolerated the injection well any complications. Post injection care was discussed with the patient. -Dispensed offloading pads. -Follow-up as scheduled or sooner if any problems arise. In the meantime, encouraged to call the office with any questions, concerns, change in symptoms.   Celesta Gentile, DPM

## 2015-03-21 ENCOUNTER — Ambulatory Visit (INDEPENDENT_AMBULATORY_CARE_PROVIDER_SITE_OTHER): Payer: BLUE CROSS/BLUE SHIELD | Admitting: Podiatry

## 2015-03-21 VITALS — BP 125/78 | HR 92 | Resp 16

## 2015-03-21 DIAGNOSIS — D3610 Benign neoplasm of peripheral nerves and autonomic nervous system, unspecified: Secondary | ICD-10-CM | POA: Diagnosis not present

## 2015-03-21 NOTE — Patient Instructions (Signed)
Morton's Neuroma in Sports  (Interdigital Plantar Neuroma) Morton's neuroma is a condition of the nervous system that results in pain or loss of feeling in the toes. The disease is caused by the bones of the foot squeezing the nerve that runs between two toes (interdigital nerve). The third and fourth toes are most likely to be affected by this disease. SYMPTOMS   Tingling, numbness, burning, or electric shocks in the front of the foot, often involving the third and fourth toes, although it may involve any other pair of toes.  Pain and tenderness in the front of the foot, that gets worse when walking.  Pain that gets worse when pressure is applied to the foot (wearing shoes).  Severe pain in the front of the foot, when standing on the front of the foot (on tiptoes), such as with running, jumping, pivoting, or dancing. CAUSES  Morton's neuroma is caused by swelling of the nerve between two toes. This swelling causes the nerve to be pinched between the bones of the foot. RISK INCREASES WITH:  Recurring foot or ankle injuries.  Poor fitting or worn shoes, with minimal padding and shock absorbers.  Loose ligaments of the foot, causing thickening of the nerve.  Poor foot strength and flexibility. PREVENTION  Warm up and stretch properly before activity.  Maintain physical fitness:  Foot and ankle flexibility.  Muscle strength and endurance.  Cardiovascular fitness.  Wear properly fitted and padded shoes.  Wear arch supports (orthotics), when needed. PROGNOSIS  If treated properly, Morton's neuroma can usually be cured with non-surgical treatment. For certain cases, surgery may be needed. RELATED COMPLICATIONS  Permanent numbness and pain in the foot.  Inability to participate in athletics, because of pain. TREATMENT Treatment first involves stopping any activities that make the symptoms worse. The use of ice and medicine will help reduce pain and inflammation. Wearing shoes  with a wide toe box, and an orthotic arch support or metatarsal bar, may also reduce pain. Your caregiver may give you a corticosteroid injection, to further reduce inflammation. If non-surgical treatment is unsuccessful, surgery may be needed. Surgery to fix Morton's neuroma is often performed as an outpatient procedure, meaning you can go home the same day as the surgery. The procedure involves removing the source of pressure on the nerve. If it is necessary to remove the nerve, you can expect persistent numbness. MEDICATION  If pain medicine is needed, nonsteroidal anti-inflammatory medicines (aspirin and ibuprofen), or other minor pain relievers (acetaminophen), are often advised.  Do not take pain medicine for 7 days before surgery.  Prescription pain relievers are usually prescribed only after surgery. Use only as directed and only as much as you need.  Corticosteroid injections are used in extreme cases, to reduce inflammation. These injections should be done only if necessary, because they may be given only a limited number of times. HEAT AND COLD  Cold treatment (icing) should be applied for 10 to 15 minutes every 2 to 3 hours for inflammation and pain, and immediately after activity that aggravates your symptoms. Use ice packs or an ice massage.  Heat treatment may be used before performing stretching and strengthening activities prescribed by your caregiver, physical therapist, or athletic trainer. Use a heat pack or a warm water soak. SEEK MEDICAL CARE IF:   Symptoms get worse or do not improve in 2 weeks, despite treatment.  After surgery you develop increasing pain, swelling, redness, increased warmth, bleeding, drainage of fluids, or fever.  New, unexplained symptoms develop. (  Drugs used in treatment may produce side effects.) Document Released: 06/05/2005 Document Revised: 10/21/2011 Document Reviewed: 11/10/2008 ExitCare Patient Information 2015 [employer]. This  information is not intended to replace advice given to you by your health care provider. Make sure you discuss any questions you have with your health care provider.  

## 2015-03-22 NOTE — Progress Notes (Signed)
Patient ID: Ruth Grant, female   DOB: 15-Dec-1950, 64 y.o.   MRN: [id number]  Subjective: 64 year old female presents the office in follow-up evaluation of right third interspace neuroma. She states that since last appointment after the injection she was doing well until last Sunday and she was walking around a department store to start has some recurrence of pain which lasted approximately 1 hour. At that she decreased her activity she had resolution of symptoms and she does not have much pain since then. She denies any numbness or table into the area. She denies any swelling or redness. No other complaints at this time in no acute changes since last appointment.  She previously undergone alcohol sclerosing injections couple years ago which seem to help.  Objective: AAO 3, NAD Neurovascular status unchanged There is mild to palpation along the right third interspace of the right foot however the pain has improved compared to last appointment. There is some subjective numbness and swelling to the toes of the third and fourth digit upon compression. There is no palpable neuroma identified at this time. There is no area of pinpoint tenderness or pain the vibratory sensation. There is no edema, erythema, increased warmth. No open lesions or pre-ulcerative lesions. No pain with calf compression, swelling, warmth, erythema.  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 Under sterile conditions a total 1.5 mL of a mixture of Kenalog 10, 0.5% Marcaine plain and 2% lidocaine plain was infiltrated in the right third interspace without, complications. She tolerated the injection well any complications. Post injection care was  discussed with the patient. -Dispensed metatarsal offloading pads. -Follow-up in 4 weeks if symptoms are not completely resolved or sooner if any problems arise. In the meantime, encouraged to call the office with any questions, concerns, change in symptoms.   Celesta Gentile, DPM

## 2015-04-18 ENCOUNTER — Ambulatory Visit (INDEPENDENT_AMBULATORY_CARE_PROVIDER_SITE_OTHER): Payer: BLUE CROSS/BLUE SHIELD | Admitting: Podiatry

## 2015-04-18 ENCOUNTER — Encounter: Payer: Self-pay | Admitting: Podiatry

## 2015-04-18 VITALS — BP 147/83 | HR 88 | Resp 18

## 2015-04-18 DIAGNOSIS — D3610 Benign neoplasm of peripheral nerves and autonomic nervous system, unspecified: Secondary | ICD-10-CM | POA: Diagnosis not present

## 2015-04-18 NOTE — Progress Notes (Signed)
Patient ID: Ruth Grant, female   DOB: Jan 07, 1951, 64 y.o.   MRN: [id number]  Subjective: 64 year old female presents the office today for follow-up evaluation of right third interspace pain, possible neuroma reoccurrence.several years ago she did undergo alcohol sclerosing injections with seem to help her symptoms however the pain has reoccurred in the last several months. She is also tried offloading pads which did not help.She has had 2 steroid injections which have provided someminimal relief but she continues to have pain to the area.she does get tingling and numbness particularly the third toe of the fourth toe. No swelling or redness. No recent injury or trauma. No other complaints at this time in no acute changes otherwise.  Objective: AAO 3, NAD Neurovascular status unchanged There is continuing of mild to palpation along the right third interspace of the right and appears to be the same as previous. There is subjective numbness and swelling to the toes of the third and fourth digit upon compression. There is no palpable neuroma identified at this time. There is no area of pinpoint tenderness or pain the vibratory sensation. There is no edema, erythema, increased warmth. No open lesions or pre-ulcerative lesions. No pain with calf compression, swelling, warmth, erythema.  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 She's previous he had alcohol sclerosing injections as well and appears that she has had recurrence. MRI to rule out neuroma versus bursitis or other pathology. -She has hold off on any further injections into the MRI. -Follow-up after MRI or sooner if any problems arise. In the meantime, encouraged to call the office with any  questions, concerns, change in symptoms.   Celesta Gentile, DPM

## 2015-04-19 ENCOUNTER — Telehealth: Payer: Self-pay | Admitting: *Deleted

## 2015-04-19 DIAGNOSIS — D3610 Benign neoplasm of peripheral nerves and autonomic nervous system, unspecified: Secondary | ICD-10-CM

## 2015-04-19 NOTE — Telephone Encounter (Addendum)
-----   Message from [name], DPM sent at 04/18/2015  8:24 PM EDT ----- Can you order an MRI of the right foot with contrast to rule out a neuroma of the 3rd interspace. MRI ordered and faxed.  [employer] states the member# is not in their system, unable to pre-cert for MRI.  Left message on home phone requesting pt contact me with insurance information.  Left message on pt's mobile phone requesting insurance information. I called pt for insurance information, she gave [employer] [place] with [id number] member # and plan # [id number].  Pre-cert started, pending finalization of [place] address, will be notified by fax.  Pre-cert received [id number], [facility]

## 2015-04-19 NOTE — Telephone Encounter (Deleted)
-----   Message from [name], DPM sent at 04/18/2015  8:24 PM EDT ----- Can you order an MRI of the right foot with contrast to rule out a neuroma of the 3rd interspace.

## 2015-04-21 ENCOUNTER — Telehealth: Payer: Self-pay | Admitting: *Deleted

## 2015-04-21 DIAGNOSIS — D3610 Benign neoplasm of peripheral nerves and autonomic nervous system, unspecified: Secondary | ICD-10-CM

## 2015-04-21 NOTE — Telephone Encounter (Signed)
Santiago Glad left message stating there need to be a change with pt's MRI of the right foot.  I called, scheduler stated pt's soft tissue MRI needs to be without and with contrast, and to reorder.  I sent order as MRI right foot without and with contrast still CPT 73720 which was confirmed by a radiology tech at the [facility].  Faxed to [phone].

## 2015-04-27 ENCOUNTER — Ambulatory Visit
Admission: RE | Admit: 2015-04-27 | Discharge: 2015-04-27 | Disposition: A | Discharge status: Home / Self Care | Payer: BLUE CROSS/BLUE SHIELD | Source: Ambulatory Visit | Attending: Podiatry | Admitting: Podiatry

## 2015-04-27 DIAGNOSIS — D3610 Benign neoplasm of peripheral nerves and autonomic nervous system, unspecified: Secondary | ICD-10-CM

## 2015-04-27 MED ORDER — GADOBENATE DIMEGLUMINE 529 MG/ML IV SOLN
15.0000 mL | Freq: Once | INTRAVENOUS | Status: AC | PRN
  Administered 2015-04-27: 15 mL via INTRAVENOUS

## 2015-05-01 DIAGNOSIS — K219 Gastro-esophageal reflux disease without esophagitis: Secondary | ICD-10-CM | POA: Insufficient documentation

## 2015-05-01 DIAGNOSIS — K449 Diaphragmatic hernia without obstruction or gangrene: Secondary | ICD-10-CM | POA: Insufficient documentation

## 2015-05-03 ENCOUNTER — Telehealth: Payer: Self-pay | Admitting: *Deleted

## 2015-05-03 NOTE — Telephone Encounter (Addendum)
-----   Message from [name], DPM sent at 05/02/2015  4:57 PM EDT ----- Can you call the patient to let her know the results of her MRI. It was suspicious for a neuroma. However, there was a lot of motion or problems with the MRI. They said they will repeat it at no cost to the patient if desired. We can leave it up to her. Thanks.   Left message informing pt I needed her to call me concerning her MRI.  Left message requesting pt call for MRI results.  Left message with Dr. Leigh Aurora statement.

## 2015-05-05 NOTE — Telephone Encounter (Deleted)
-----   Message from [name], DPM sent at 05/02/2015  4:57 PM EDT ----- Can you call the patient to let her know the results of her MRI. It was suspicious for a neuroma. However, there was a lot of motion or problems with the MRI. They said they will repeat it at no cost to the patient if desired. We can leave it up to her. Thanks.

## 2015-05-08 ENCOUNTER — Telehealth: Payer: Self-pay | Admitting: *Deleted

## 2015-05-08 NOTE — Telephone Encounter (Signed)
Pt called for MRI results.  Informed pt of the results and the option to take the MRI at no cost to her.  Pt states she will have the MRI again, and I informed her we would call her when those results were back.

## 2015-05-18 ENCOUNTER — Telehealth: Payer: Self-pay | Admitting: *Deleted

## 2015-05-18 DIAGNOSIS — M069 Rheumatoid arthritis, unspecified: Secondary | ICD-10-CM

## 2015-05-18 NOTE — Telephone Encounter (Addendum)
Pt request MRI results from Monday. Left message informing pt the MRI results had no been finalized, but I would call with instructions once Dr. Jacqualyn Posey reviewed.

## 2015-05-19 ENCOUNTER — Telehealth: Payer: Self-pay | Admitting: *Deleted

## 2015-05-19 NOTE — Telephone Encounter (Signed)
Informed pt Dr. Jacqualyn Posey stated the MRI showed arthritis and he would like her to have labwork drawn to distinguish the severity and type.  Informed pt.

## 2015-05-23 LAB — SEDIMENTATION RATE: Sed Rate: 12 mm/hr (ref 0–40)

## 2015-05-23 LAB — ANA: Anti Nuclear Antibody(ANA): NEGATIVE

## 2015-05-23 LAB — C-REACTIVE PROTEIN: CRP: 0.4 mg/L (ref 0.0–4.9)

## 2015-05-23 LAB — RHEUMATOID FACTOR: Rhuematoid fact SerPl-aCnc: <10 IU/mL (ref 0.0–13.9)

## 2015-05-25 ENCOUNTER — Telehealth: Payer: Self-pay | Admitting: *Deleted

## 2015-05-25 NOTE — Telephone Encounter (Addendum)
Pt called for lab results.  Left message informing pt to make an appt.

## 2015-05-27 NOTE — Telephone Encounter (Signed)
Labs were negative. MRI shoes arthritis, but no neuroma. She can come in to further discuss

## 2015-06-01 ENCOUNTER — Ambulatory Visit (INDEPENDENT_AMBULATORY_CARE_PROVIDER_SITE_OTHER): Payer: BLUE CROSS/BLUE SHIELD | Admitting: Podiatry

## 2015-06-01 ENCOUNTER — Encounter: Payer: Self-pay | Admitting: Podiatry

## 2015-06-01 VITALS — BP 150/91 | HR 93 | Resp 18

## 2015-06-01 DIAGNOSIS — D3610 Benign neoplasm of peripheral nerves and autonomic nervous system, unspecified: Secondary | ICD-10-CM | POA: Diagnosis not present

## 2015-06-01 DIAGNOSIS — G5761 Lesion of plantar nerve, right lower limb: Secondary | ICD-10-CM | POA: Diagnosis not present

## 2015-06-02 NOTE — Progress Notes (Signed)
Patient ID: Ruth Grant, female   DOB: [birth year], 64 y.o.   MRN: [id number]  Subjective: Patient presents the office he discuss MRI results. She states that she continues to have pain in the right third interspace. She occasionally gets some numbness and tingling to her third toe. She states that this pain that she has now is the same pain that she had previously when she was diagnosed with a neuroma and she had alcohol sclerosing injections. She states that after a series of injections that didn't help and lasted for approximately 3 years. She denies any recent injury or trauma. No swelling or redness. No other complaints at this time.  Objective: AAO 3, NAD DP/PT pulses 2/4, CRT less than 3 seconds Protective sensation intact with Derrel Nip monofilament there is subjective numbness of the third toe on the right foot. There is tenderness to palpation continuing the third interspace and the right foot between the metatarsal heads. There is no clicking sensation or palpable neuroma identified at this time. There is no specific area pinpoint bony tenderness or pain the vibratory sensation of bilateral lower extremity is. There is no overlying edema, erythema, increase in warmth. There are no open lesions or pre-ulcerative lesions. There is no pain with calf compression, swelling, warmth, erythema.  Assessment: 64 year old female continuation a right third interspace pain, cannot rule out neuroma  Plan: MRI results are discussed the patient. Blood work was also discussed the patient. On the initial MRI, although poor quality, did reveal possible neuroma. However on repeat MRI did not reveal a neuroma. On clinical exam she does have pain to this interspace and subjectively she states the pain is the same as what it was and she had a neuroma. I discussed treatment options. She elected to proceed with another attempted alcohol sclerosing injections as his help previously knowing that the MRI did  not show an neuroma. I also discussed risks, complications of repeated injections for which she understands and verbally consents. Under sterile conditions a total of 1 mL of dehydrated alcohol was infiltrated into the area of maximal tenderness in the right third interspace about complications. Post procedure instructions were discussed the patient. Follow-up in 2-3 weeks or repeat injection or sooner if any problems are to arise. Call any questions concerns in the meantime.  Celesta Gentile, DPM

## 2015-06-20 ENCOUNTER — Ambulatory Visit (INDEPENDENT_AMBULATORY_CARE_PROVIDER_SITE_OTHER): Payer: BLUE CROSS/BLUE SHIELD | Admitting: Podiatry

## 2015-06-20 ENCOUNTER — Encounter: Payer: Self-pay | Admitting: Podiatry

## 2015-06-20 VITALS — BP 142/89 | HR 86 | Resp 12

## 2015-06-20 DIAGNOSIS — G5761 Lesion of plantar nerve, right lower limb: Secondary | ICD-10-CM

## 2015-06-20 DIAGNOSIS — D3610 Benign neoplasm of peripheral nerves and autonomic nervous system, unspecified: Secondary | ICD-10-CM

## 2015-06-21 NOTE — Progress Notes (Signed)
Patient ID: Ruth Grant, female   DOB: [birth year], 64 y.o.   MRN: [id number]  Subjective: 64 year old female presents the office. Evaluation of right third interspace pain, possible neuroma. She states after the alcohol sclerosing injection last appointment she has had improvement in symptoms that she does continue to get some tingling and numbness the third and fourth toes although it has improved. She would proceed with another injection at this time. No recent injury or trauma. No swelling or redness. No complications after the last injection.  Objective: AAO 3, NAD DP/PT pulses 2/4, CRT less than 3 seconds Protective sensation intact with Simms Weinstein monofilament There is mild continued tenderness to palpation on the third interspace and the right foot the metatarsal heads. There is no palpable neuroma identified this time although there is subjective numbness and tingling in the third and fourth toes. There is no area pinpoint bony tenderness or pain the vibratory sensation. No amount edema, erythema, increase in warmth. There is no pain with calf compression, swelling, warmth, erythema.  Assessment: 64 year old female with continued but improved right third interspace pain  Plan: -Treatment options discussed including all alternatives, risks, and complications - At this time she wishes to proceed with another injection. At this moment a total of 1 mL of dehydrated all calls infiltrated into the area of maximal tenderness in the right third interspace without any complications. Post injection care was discussed. -Follow-up in 2-3 weeks or sooner if any problems arise. In the meantime, encouraged to call the office with any questions, concerns, change in symptoms.   Celesta Gentile, DPM

## 2015-07-11 ENCOUNTER — Encounter: Payer: Self-pay | Admitting: Podiatry

## 2015-07-11 ENCOUNTER — Ambulatory Visit (INDEPENDENT_AMBULATORY_CARE_PROVIDER_SITE_OTHER): Payer: BLUE CROSS/BLUE SHIELD | Admitting: Podiatry

## 2015-07-11 DIAGNOSIS — G5761 Lesion of plantar nerve, right lower limb: Secondary | ICD-10-CM

## 2015-07-11 DIAGNOSIS — D3610 Benign neoplasm of peripheral nerves and autonomic nervous system, unspecified: Secondary | ICD-10-CM

## 2015-07-11 NOTE — Progress Notes (Signed)
Patient ID: Ruth Grant, female   DOB: 03/10/1951, 64 y.o.   MRN: [id number]  Subjective: 64 year old female presents the office today for follow-up evaluation of right third interspace pain, possible neuroma. She states after the alcohol sclerosing injection last appointment she has had continued improvement in symptoms that she does continue to get some tingling and numbness the third and fourth toes although it has improved.  Feeling has very mild intermittent discomfort has greatly decreased last appointment. She would proceed with another injection at this time. No recent injury or trauma. No swelling or redness. No complications after the last injection.  Objective: AAO 3, NAD DP/PT pulses 2/4, CRT less than 3 seconds Protective sensation intact with Simms Weinstein monofilament There is mild but improved continued tenderness to palpation on the third interspace and the right foot the metatarsal heads. There is no palpable neuroma identified this time although there is subjective numbness and tingling in the third and fourth toes. There is no area pinpoint bony tenderness or pain the vibratory sensation. No amount edema, erythema, increase in warmth. There is no pain with calf compression, swelling, warmth, erythema.  Assessment: 64 year old female with continued but improved right third interspace pain  Plan: -Treatment options discussed including all alternatives, risks, and complications - At this time she wishes to proceed with another injection. At this moment a total of 1 mL of dehydrated all calls infiltrated into the area of maximal tenderness in the right third interspace without any complications. Post injection care was discussed.  This was her third injection. -Follow-up in 4 weeks if symptoms continue or sooner if any problems arise. In the meantime, encouraged to call the office with any questions, concerns, change in symptoms.   Celesta Gentile, DPM

## 2015-08-01 ENCOUNTER — Ambulatory Visit: Payer: BLUE CROSS/BLUE SHIELD | Admitting: Podiatry

## 2016-01-18 DIAGNOSIS — E782 Mixed hyperlipidemia: Secondary | ICD-10-CM | POA: Insufficient documentation

## 2016-02-28 DIAGNOSIS — E538 Deficiency of other specified B group vitamins: Secondary | ICD-10-CM | POA: Insufficient documentation

## 2016-03-07 ENCOUNTER — Other Ambulatory Visit: Payer: Self-pay | Admitting: Internal Medicine

## 2016-03-07 DIAGNOSIS — Z1231 Encounter for screening mammogram for malignant neoplasm of breast: Secondary | ICD-10-CM

## 2016-04-08 ENCOUNTER — Ambulatory Visit
Admission: RE | Admit: 2016-04-08 | Discharge: 2016-04-08 | Disposition: A | Discharge status: Home / Self Care | Payer: BLUE CROSS/BLUE SHIELD | Source: Ambulatory Visit | Attending: Internal Medicine | Admitting: Internal Medicine

## 2016-04-08 ENCOUNTER — Other Ambulatory Visit: Payer: Self-pay | Admitting: Internal Medicine

## 2016-04-08 DIAGNOSIS — Z1231 Encounter for screening mammogram for malignant neoplasm of breast: Secondary | ICD-10-CM | POA: Insufficient documentation

## 2016-04-09 ENCOUNTER — Encounter: Payer: Self-pay | Admitting: Podiatry

## 2016-04-09 ENCOUNTER — Ambulatory Visit (INDEPENDENT_AMBULATORY_CARE_PROVIDER_SITE_OTHER): Payer: BLUE CROSS/BLUE SHIELD | Admitting: Podiatry

## 2016-04-09 DIAGNOSIS — D3610 Benign neoplasm of peripheral nerves and autonomic nervous system, unspecified: Secondary | ICD-10-CM

## 2016-04-09 DIAGNOSIS — G5761 Lesion of plantar nerve, right lower limb: Secondary | ICD-10-CM

## 2016-04-22 NOTE — Progress Notes (Signed)
Patient ID: Ruth Grant, female   DOB: 1951/04/08, 65 y.o.   MRN: [id number]  Subjective: 65 year old female presents the office today for recurrence of right third interspace pain, possible neuroma. She states that she gets alcohol injection she does well. She had 3 injections last year with me and previous to that she had 2. She has numbness and tingling to the right third and fourth toe. No recent injury or trauma. No swelling or redness.No complications after the last injection.  Objective: AAO 3, NAD DP/PT pulses 2/4, CRT less than 3 seconds Protective sensation intact with Simms Weinstein monofilament There is recurrence of tenderness to palpation on the third interspace. There is no palpable neuroma identified this time although there is subjective numbness and tingling in the third and fourth toes as well. There is no area pinpoint bony tenderness or pain the vibratory sensation. No amount edema, erythema, increase in warmth. There is no pain with calf compression, swelling, warmth, erythema.  Assessment: 65 year old female with continued but improved right third interspace pain  Plan: -Treatment options discussed including all alternatives, risks, and complications - At this time she wishes to proceed with another injection. At this moment a total of 1 mL of dehydrated alcohol was infiltrated into the area of maximal tenderness in the right third interspace without any complications. Post injection care was discussed.  -Follow-up in 4 weeks if symptoms continue or sooner if any problems arise. In the meantime, encouraged to call the office with any questions, concerns, change in symptoms.   Celesta Gentile, DPM

## 2016-05-02 ENCOUNTER — Ambulatory Visit (INDEPENDENT_AMBULATORY_CARE_PROVIDER_SITE_OTHER): Payer: BLUE CROSS/BLUE SHIELD | Admitting: Podiatry

## 2016-05-02 ENCOUNTER — Encounter: Payer: Self-pay | Admitting: Podiatry

## 2016-05-02 DIAGNOSIS — D3610 Benign neoplasm of peripheral nerves and autonomic nervous system, unspecified: Secondary | ICD-10-CM

## 2016-05-02 DIAGNOSIS — G5762 Lesion of plantar nerve, left lower limb: Secondary | ICD-10-CM | POA: Diagnosis not present

## 2016-05-02 DIAGNOSIS — G5761 Lesion of plantar nerve, right lower limb: Secondary | ICD-10-CM

## 2016-05-02 DIAGNOSIS — L84 Corns and callosities: Secondary | ICD-10-CM

## 2016-05-02 NOTE — Progress Notes (Signed)
Patient ID: JIAVANNA TEMPESTA, female   DOB: 05/14/1951, 65 y.o.   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 She states the pain has improved but she still waiting some discomfort of the right third interspace. She presents today for second alcohol sclerosing injection.   She is also given a corn between her first and second toes bilaterally. She is asking taken be trimmed as well as the presenting she can do to help prevent this. Denies any redness or drainage or swelling. Some ongoing for quite some time.  She had 3 injections last year with me and previous to that she had 2 others.   Denies any systemic complaints such as fevers, chills, nausea, vomiting. No calf pain, chest pain, shortness of breath.  Objective: AAO 3, NAD DP/PT pulses 2/4, CRT less than 3 seconds Protective sensation intact with Simms Weinstein monofilament There is decreased but mild continued tenderness to palpation on the third interspace. There is no palpable neuroma identified this time although there is subjective numbness and tingling in the third and fourth toes as well. There is no area pinpoint bony tenderness or pain the vibratory sensation. No amount edema, erythema, increase in warmth. There is no pain with calf compression, swelling, warmth, erythema. Hyperkeratotic lesions on the adjacent sides of the hallux and second toes bilaterally. Upon debridement no underlying ulceration, drainage or signs of infection.  Assessment: 65 year old female with continued but improved right third interspace pain; hyperkeratotic lesions  Plan: -Treatment options discussed including all alternatives, risks, and complications - At this time she wishes to proceed with another injection. At this time a total of 1 mL of dehydrated alcohol was infiltrated into the area of maximal tenderness in the right third interspace without any complications.  Post injection care was discussed.   -Hyperkeratotic lesions debrided 4 without complications or bleeding. Dispensed toe separators. -Follow-up in 4 weeks if symptoms continue or sooner if any problems arise. In the meantime, encouraged to call the office with any questions, concerns, change in symptoms.   Celesta Gentile, DPM

## 2016-05-30 ENCOUNTER — Ambulatory Visit: Payer: BLUE CROSS/BLUE SHIELD | Admitting: Podiatry

## 2016-05-30 ENCOUNTER — Encounter: Payer: Self-pay | Admitting: *Deleted

## 2016-05-31 ENCOUNTER — Ambulatory Visit: Payer: BLUE CROSS/BLUE SHIELD | Admitting: Anesthesiology

## 2016-05-31 ENCOUNTER — Encounter
Admission: RE | Disposition: A | Discharge status: Home / Self Care | Payer: Self-pay | Source: Ambulatory Visit | Attending: Unknown Physician Specialty

## 2016-05-31 ENCOUNTER — Ambulatory Visit
Admission: RE | Admit: 2016-05-31 | Discharge: 2016-05-31 | Disposition: A | Discharge status: Home / Self Care | Payer: BLUE CROSS/BLUE SHIELD | Source: Ambulatory Visit | Attending: Unknown Physician Specialty | Admitting: Unknown Physician Specialty

## 2016-05-31 ENCOUNTER — Encounter: Payer: Self-pay | Admitting: *Deleted

## 2016-05-31 DIAGNOSIS — E785 Hyperlipidemia, unspecified: Secondary | ICD-10-CM | POA: Diagnosis not present

## 2016-05-31 DIAGNOSIS — K648 Other hemorrhoids: Secondary | ICD-10-CM | POA: Diagnosis not present

## 2016-05-31 DIAGNOSIS — K219 Gastro-esophageal reflux disease without esophagitis: Secondary | ICD-10-CM | POA: Diagnosis not present

## 2016-05-31 DIAGNOSIS — Z79899 Other long term (current) drug therapy: Secondary | ICD-10-CM | POA: Diagnosis not present

## 2016-05-31 DIAGNOSIS — Z7982 Long term (current) use of aspirin: Secondary | ICD-10-CM | POA: Diagnosis not present

## 2016-05-31 DIAGNOSIS — Z1211 Encounter for screening for malignant neoplasm of colon: Secondary | ICD-10-CM | POA: Insufficient documentation

## 2016-05-31 HISTORY — PX: COLONOSCOPY WITH PROPOFOL: SHX5780

## 2016-05-31 SURGERY — COLONOSCOPY WITH PROPOFOL
Anesthesia: General

## 2016-05-31 MED ORDER — LACTATED RINGERS IV SOLN
INTRAVENOUS | Status: DC | PRN
  Administered 2016-05-31: 10:00:00 via INTRAVENOUS

## 2016-05-31 MED ORDER — SODIUM CHLORIDE 0.9 % IV SOLN: INTRAVENOUS | Status: DC

## 2016-05-31 MED ORDER — PROPOFOL 500 MG/50ML IV EMUL
INTRAVENOUS | Status: DC | PRN
  Administered 2016-05-31: 125 ug/kg/min via INTRAVENOUS

## 2016-05-31 MED ORDER — SODIUM CHLORIDE 0.9 % IV SOLN
INTRAVENOUS | Status: DC
  Administered 2016-05-31: 10:00:00 via INTRAVENOUS

## 2016-05-31 MED ORDER — PROPOFOL 10 MG/ML IV BOLUS
INTRAVENOUS | Status: DC | PRN
  Administered 2016-05-31 (×2): 50 mg via INTRAVENOUS

## 2016-05-31 NOTE — Anesthesia Postprocedure Evaluation (Signed)
Anesthesia Post Note  Patient: Ruth Grant  Procedure(s) Performed: Procedure(s) (LRB): COLONOSCOPY WITH PROPOFOL (N/A)  Patient location during evaluation: Endoscopy Anesthesia Type: General Level of consciousness: awake and alert Pain management: pain level controlled Vital Signs Assessment: post-procedure vital signs reviewed and stable Respiratory status: spontaneous breathing, nonlabored ventilation, respiratory function stable and patient connected to nasal cannula oxygen Cardiovascular status: blood pressure returned to baseline and stable Postop Assessment: no signs of nausea or vomiting Anesthetic complications: no    Last Vitals:  Vitals:   05/31/16 0937 05/31/16 1020  BP: (!) 173/89 (!) 110/53  Pulse: 92 91  Resp: 18 18  Temp: 36.5 C (!) 35.6 C    Last Pain:  Vitals:   05/31/16 1020  TempSrc: Tympanic                 Precious Haws Patrica Mendell

## 2016-05-31 NOTE — Anesthesia Preprocedure Evaluation (Signed)
Anesthesia Evaluation  Patient identified by MRN, date of birth, ID band Patient awake    Reviewed: Allergy & Precautions, H&P , NPO status , Patient's Chart, lab work & pertinent test results  History of Anesthesia Complications Negative for: history of anesthetic complications  Airway Mallampati: III  TM Distance: >3 FB Neck ROM: full    Dental  (+) Poor Dentition   Pulmonary neg shortness of breath, asthma ,    Pulmonary exam normal breath sounds clear to auscultation       Cardiovascular Exercise Tolerance: Good (-) angina(-) Past MI and (-) DOE negative cardio ROS Normal cardiovascular exam Rhythm:regular Rate:Normal     Neuro/Psych  Neuromuscular disease negative psych ROS   GI/Hepatic Neg liver ROS, GERD  Controlled,  Endo/Other  negative endocrine ROS  Renal/GU negative Renal ROS  negative genitourinary   Musculoskeletal   Abdominal   Peds  Hematology negative hematology ROS (+)   Anesthesia Other Findings Past Medical History: No date: Allergic rhinitis No date: Esophageal reflux No date: Herpes zoster No date: Hyperlipidemia No date: Neuralgia No date: Osteoporosis No date: Postmenopausal state  Past Surgical History: No date: CHOLECYSTECTOMY No date: TUBAL LIGATION  BMI    Body Mass Index:  30.18 kg/m      Reproductive/Obstetrics negative OB ROS                             Anesthesia Physical Anesthesia Plan  ASA: III  Anesthesia Plan: General   Post-op Pain Management:    Induction:   Airway Management Planned:   Additional Equipment:   Intra-op Plan:   Post-operative Plan:   Informed Consent: I have reviewed the patients History and Physical, chart, labs and discussed the procedure including the risks, benefits and alternatives for the proposed anesthesia with the patient or authorized representative who has indicated his/her understanding and  acceptance.   Dental Advisory Given  Plan Discussed with: Anesthesiologist, CRNA and Surgeon  Anesthesia Plan Comments:         Anesthesia Quick Evaluation

## 2016-05-31 NOTE — Transfer of Care (Signed)
Immediate Anesthesia Transfer of Care Note  Patient: Ruth Grant  Procedure(s) Performed: Procedure(s): COLONOSCOPY WITH PROPOFOL (N/A)  Patient Location: PACU and Endoscopy Unit  Anesthesia Type:General  Level of Consciousness: awake, alert  and oriented  Airway & Oxygen Therapy: Patient Spontanous Breathing and Patient connected to nasal cannula oxygen  Post-op Assessment: Report given to RN and Post -op Vital signs reviewed and stable  Post vital signs: Reviewed and stable  Last Vitals:  Vitals:   05/31/16 0937  BP: (!) 173/89  Pulse: 92  Resp: 18  Temp: 36.5 C    Last Pain:  Vitals:   05/31/16 0937  TempSrc: Tympanic         Complications: No apparent anesthesia complications

## 2016-05-31 NOTE — Op Note (Signed)
[facility] Patient Name: Ruth Grant Procedure Date: 05/31/2016 9:54 AM MRN: [id number] Account #: [id number] Date of Birth: 1951/03/01 Admit Type: Outpatient Age: 65 Room: [facility] ENDO ROOM 1 Gender: Female Note Status: Finalized Procedure:            Colonoscopy Indications:          Screening for colorectal malignant neoplasm Providers:            Manya Silvas, MD Referring MD:         Rusty Aus, MD (Referring MD) Medicines:            Propofol per Anesthesia Complications:        No immediate complications. Procedure:            Pre-Anesthesia Assessment:                       - After reviewing the risks and benefits, the patient                        was deemed in satisfactory condition to undergo the                        procedure.                       After obtaining informed consent, the colonoscope was                        passed under direct vision. Throughout the procedure,                        the patient's blood pressure, pulse, and oxygen                        saturations were monitored continuously. The                        Colonoscope was introduced through the anus and                        advanced to the the cecum, identified by appendiceal                        orifice and ileocecal valve. The colonoscopy was                        performed without difficulty. The patient tolerated the                        procedure. The quality of the bowel preparation was                        excellent. Findings:      Internal hemorrhoids were found during endoscopy. The hemorrhoids were       small and Grade I (internal hemorrhoids that do not prolapse).      The exam was otherwise without abnormality. Prep was excellent. Impression:           - Internal hemorrhoids.                       - The  examination was otherwise normal.                       - No specimens collected. Recommendation:       - Repeat  colonoscopy in 10 years for screening purposes. Manya Silvas, MD 05/31/2016 10:19:48 AM This report has been signed electronically. Number of Addenda: 0 Note Initiated On: 05/31/2016 9:54 AM Scope Withdrawal Time: 0 hours 9 minutes 53 seconds  Total Procedure Duration: 0 hours 17 minutes 46 seconds       [facility]

## 2016-05-31 NOTE — H&P (Signed)
Primary Care Physician:  Rusty Aus, MD Primary Gastroenterologist:  Dr. Vira Agar  Pre-Procedure History & Physical: HPI:  Ruth Grant is a 65 y.o. female is here for an colonoscopy.   Past Medical History:  Diagnosis Date  . Allergic rhinitis   . Esophageal reflux   . Herpes zoster   . Hyperlipidemia   . Neuralgia   . Osteoporosis   . Postmenopausal state     Past Surgical History:  Procedure Laterality Date  . CHOLECYSTECTOMY    . TUBAL LIGATION      Prior to Admission medications   Medication Sig Start Date End Date Taking? Authorizing Provider  aspirin 81 MG tablet Take 81 mg by mouth daily.   Yes Historical Provider, MD  cholecalciferol (VITAMIN D) 1000 UNITS tablet Take 1,000 Units by mouth daily.   Yes Historical Provider, MD  fluticasone (FLONASE) 50 MCG/ACT nasal spray Place into both nostrils daily.   Yes Historical Provider, MD  levocetirizine (XYZAL) 5 MG tablet Take 5 mg by mouth every evening.   Yes Historical Provider, MD  montelukast (SINGULAIR) 10 MG tablet Take 1 tablet (10 mg total) by mouth at bedtime. 02/17/12 05/31/16 Yes Tanda Rockers, MD  omeprazole-sodium bicarbonate (ZEGERID) 40-1100 MG per capsule Take 30-60 min before first meal of the day 02/18/12 05/31/16 Yes Tanda Rockers, MD  raloxifene (EVISTA) 60 MG tablet Take 60 mg by mouth daily.   Yes Historical Provider, MD  sertraline (ZOLOFT) 50 MG tablet Take 50 mg by mouth daily.   Yes Historical Provider, MD  simvastatin (ZOCOR) 40 MG tablet Take 40 mg by mouth at bedtime.   Yes Historical Provider, MD  budesonide-formoterol (SYMBICORT) 160-4.5 MCG/ACT inhaler Take 2 puffs first thing in am and then another 2 puffs about 12 hours later. 07/24/12   Tanda Rockers, MD  famotidine (PEPCID) 20 MG tablet One at bedtime Patient not taking: Reported on 05/31/2016 02/18/12 02/17/13  Tanda Rockers, MD    Allergies as of 04/04/2016 - Review Complete 07/11/2015  Allergen Reaction Noted  . Ace inhibitors  Cough 02/14/2012    Family History  Problem Relation Age of Onset  . Breast cancer Sister   . Coronary artery disease Mother   . Hypertension Mother   . Hyperlipidemia Mother   . Asthma Sister   . Breast cancer Sister   . Diabetes Sister   . Breast cancer Sister   . Emphysema Father     smoked  . Emphysema Brother     smoked  . Emphysema Sister     smoked  . Emphysema Sister     never a smoker    Social History   Social History  . Marital status: Married    Spouse name: N/A  . Number of children: 2  . Years of education: N/A   Occupational History  . vice president Ruth Grant   Social History Main Topics  . Smoking status: Never Smoker  . Smokeless tobacco: Never Used  . Alcohol use 0.0 oz/week     Comment: wine twice per month  . Drug use: No  . Sexual activity: Not on file   Other Topics Concern  . Not on file   Social History Narrative  . No narrative on file    Review of Systems: See HPI, otherwise negative ROS  Physical Exam: BP (!) (P) 110/53   Grant (P) 91   Temp (!) (P) 96 F (35.6 C) (Tympanic)   Resp (  P) 18   Ht 5\' 2"  (1.575 m)   Wt 74.8 kg (165 lb)   LMP  (LMP Unknown)   SpO2 (P) 100%   BMI 30.18 kg/m  General:   Alert,  pleasant and cooperative in NAD Head:  Normocephalic and atraumatic. Neck:  Supple; no masses or thyromegaly. Lungs:  Clear throughout to auscultation.    Heart:  Regular rate and rhythm. Abdomen:  Soft, nontender and nondistended. Normal bowel sounds, without guarding, and without rebound.   Neurologic:  Alert and  oriented x4;  grossly normal neurologically.  Impression/Plan: Ruth Grant is here for an colonoscopy to be performed for screening exam  Risks, benefits, limitations, and alternatives regarding  colonoscopy have been reviewed with the patient.  Questions have been answered.  All parties agreeable.   Gaylyn Cheers, MD  05/31/2016, 10:24 AM

## 2016-06-01 ENCOUNTER — Encounter: Payer: Self-pay | Admitting: Unknown Physician Specialty

## 2016-06-06 ENCOUNTER — Ambulatory Visit: Payer: BLUE CROSS/BLUE SHIELD | Admitting: Podiatry

## 2016-06-19 DIAGNOSIS — J301 Allergic rhinitis due to pollen: Secondary | ICD-10-CM | POA: Diagnosis not present

## 2016-07-08 DIAGNOSIS — E538 Deficiency of other specified B group vitamins: Secondary | ICD-10-CM | POA: Diagnosis not present

## 2016-08-20 DIAGNOSIS — J4522 Mild intermittent asthma with status asthmaticus: Secondary | ICD-10-CM | POA: Diagnosis not present

## 2016-08-20 DIAGNOSIS — J40 Bronchitis, not specified as acute or chronic: Secondary | ICD-10-CM | POA: Diagnosis not present

## 2016-09-12 DIAGNOSIS — J301 Allergic rhinitis due to pollen: Secondary | ICD-10-CM | POA: Diagnosis not present

## 2016-09-20 DIAGNOSIS — J301 Allergic rhinitis due to pollen: Secondary | ICD-10-CM | POA: Diagnosis not present

## 2016-09-30 DIAGNOSIS — M5116 Intervertebral disc disorders with radiculopathy, lumbar region: Secondary | ICD-10-CM | POA: Diagnosis not present

## 2016-10-02 DIAGNOSIS — J301 Allergic rhinitis due to pollen: Secondary | ICD-10-CM | POA: Diagnosis not present

## 2016-10-08 DIAGNOSIS — J40 Bronchitis, not specified as acute or chronic: Secondary | ICD-10-CM | POA: Diagnosis not present

## 2016-10-08 DIAGNOSIS — J4522 Mild intermittent asthma with status asthmaticus: Secondary | ICD-10-CM | POA: Diagnosis not present

## 2017-01-10 DIAGNOSIS — J301 Allergic rhinitis due to pollen: Secondary | ICD-10-CM | POA: Diagnosis not present

## 2017-02-03 DIAGNOSIS — D485 Neoplasm of uncertain behavior of skin: Secondary | ICD-10-CM | POA: Diagnosis not present

## 2017-02-03 DIAGNOSIS — S80862A Insect bite (nonvenomous), left lower leg, initial encounter: Secondary | ICD-10-CM | POA: Diagnosis not present

## 2017-02-03 DIAGNOSIS — Z85828 Personal history of other malignant neoplasm of skin: Secondary | ICD-10-CM | POA: Diagnosis not present

## 2017-02-03 DIAGNOSIS — D2261 Melanocytic nevi of right upper limb, including shoulder: Secondary | ICD-10-CM | POA: Diagnosis not present

## 2017-02-05 DIAGNOSIS — J301 Allergic rhinitis due to pollen: Secondary | ICD-10-CM | POA: Diagnosis not present

## 2017-02-21 DIAGNOSIS — E538 Deficiency of other specified B group vitamins: Secondary | ICD-10-CM | POA: Diagnosis not present

## 2017-02-21 DIAGNOSIS — Z0000 Encounter for general adult medical examination without abnormal findings: Secondary | ICD-10-CM | POA: Diagnosis not present

## 2017-03-05 DIAGNOSIS — E782 Mixed hyperlipidemia: Secondary | ICD-10-CM | POA: Diagnosis not present

## 2017-03-05 DIAGNOSIS — E538 Deficiency of other specified B group vitamins: Secondary | ICD-10-CM | POA: Diagnosis not present

## 2017-03-05 DIAGNOSIS — Z23 Encounter for immunization: Secondary | ICD-10-CM | POA: Diagnosis not present

## 2017-03-05 DIAGNOSIS — M810 Age-related osteoporosis without current pathological fracture: Secondary | ICD-10-CM | POA: Diagnosis not present

## 2017-03-05 DIAGNOSIS — Z0000 Encounter for general adult medical examination without abnormal findings: Secondary | ICD-10-CM | POA: Diagnosis not present

## 2017-03-14 ENCOUNTER — Other Ambulatory Visit: Payer: Self-pay | Admitting: Internal Medicine

## 2017-03-14 DIAGNOSIS — Z1231 Encounter for screening mammogram for malignant neoplasm of breast: Secondary | ICD-10-CM

## 2017-04-01 DIAGNOSIS — E538 Deficiency of other specified B group vitamins: Secondary | ICD-10-CM | POA: Diagnosis not present

## 2017-04-10 ENCOUNTER — Ambulatory Visit
Admission: RE | Admit: 2017-04-10 | Discharge: 2017-04-10 | Disposition: A | Discharge status: Home / Self Care | Payer: PPO | Source: Ambulatory Visit | Attending: Internal Medicine | Admitting: Internal Medicine

## 2017-04-10 DIAGNOSIS — Z1231 Encounter for screening mammogram for malignant neoplasm of breast: Secondary | ICD-10-CM | POA: Insufficient documentation

## 2017-05-02 DIAGNOSIS — E538 Deficiency of other specified B group vitamins: Secondary | ICD-10-CM | POA: Diagnosis not present

## 2017-05-30 DIAGNOSIS — J301 Allergic rhinitis due to pollen: Secondary | ICD-10-CM | POA: Diagnosis not present

## 2017-06-03 DIAGNOSIS — E538 Deficiency of other specified B group vitamins: Secondary | ICD-10-CM | POA: Diagnosis not present

## 2017-06-04 DIAGNOSIS — J301 Allergic rhinitis due to pollen: Secondary | ICD-10-CM | POA: Diagnosis not present

## 2017-07-08 DIAGNOSIS — E538 Deficiency of other specified B group vitamins: Secondary | ICD-10-CM | POA: Diagnosis not present

## 2017-08-11 DIAGNOSIS — E538 Deficiency of other specified B group vitamins: Secondary | ICD-10-CM | POA: Diagnosis not present

## 2017-09-11 DIAGNOSIS — E538 Deficiency of other specified B group vitamins: Secondary | ICD-10-CM | POA: Diagnosis not present

## 2017-09-15 DIAGNOSIS — J309 Allergic rhinitis, unspecified: Secondary | ICD-10-CM | POA: Diagnosis not present

## 2017-10-01 DIAGNOSIS — H2513 Age-related nuclear cataract, bilateral: Secondary | ICD-10-CM | POA: Diagnosis not present

## 2017-10-23 DIAGNOSIS — E538 Deficiency of other specified B group vitamins: Secondary | ICD-10-CM | POA: Diagnosis not present

## 2017-12-23 DIAGNOSIS — E538 Deficiency of other specified B group vitamins: Secondary | ICD-10-CM | POA: Diagnosis not present

## 2018-01-23 DIAGNOSIS — E538 Deficiency of other specified B group vitamins: Secondary | ICD-10-CM | POA: Diagnosis not present

## 2018-02-05 DIAGNOSIS — R300 Dysuria: Secondary | ICD-10-CM | POA: Diagnosis not present

## 2018-02-16 DIAGNOSIS — R7989 Other specified abnormal findings of blood chemistry: Secondary | ICD-10-CM | POA: Insufficient documentation

## 2018-02-16 DIAGNOSIS — E538 Deficiency of other specified B group vitamins: Secondary | ICD-10-CM | POA: Diagnosis not present

## 2018-02-16 DIAGNOSIS — Z79899 Other long term (current) drug therapy: Secondary | ICD-10-CM | POA: Diagnosis not present

## 2018-02-16 DIAGNOSIS — E782 Mixed hyperlipidemia: Secondary | ICD-10-CM | POA: Diagnosis not present

## 2018-02-16 DIAGNOSIS — M5430 Sciatica, unspecified side: Secondary | ICD-10-CM | POA: Diagnosis not present

## 2018-03-16 DIAGNOSIS — E538 Deficiency of other specified B group vitamins: Secondary | ICD-10-CM | POA: Diagnosis not present

## 2018-03-17 ENCOUNTER — Other Ambulatory Visit: Payer: Self-pay | Admitting: Internal Medicine

## 2018-03-17 DIAGNOSIS — Z1231 Encounter for screening mammogram for malignant neoplasm of breast: Secondary | ICD-10-CM

## 2018-03-31 DIAGNOSIS — R7989 Other specified abnormal findings of blood chemistry: Secondary | ICD-10-CM | POA: Diagnosis not present

## 2018-03-31 DIAGNOSIS — E782 Mixed hyperlipidemia: Secondary | ICD-10-CM | POA: Diagnosis not present

## 2018-03-31 DIAGNOSIS — E538 Deficiency of other specified B group vitamins: Secondary | ICD-10-CM | POA: Diagnosis not present

## 2018-03-31 DIAGNOSIS — Z79899 Other long term (current) drug therapy: Secondary | ICD-10-CM | POA: Diagnosis not present

## 2018-04-07 DIAGNOSIS — Z0000 Encounter for general adult medical examination without abnormal findings: Secondary | ICD-10-CM | POA: Insufficient documentation

## 2018-04-07 DIAGNOSIS — M810 Age-related osteoporosis without current pathological fracture: Secondary | ICD-10-CM | POA: Diagnosis not present

## 2018-04-07 DIAGNOSIS — E538 Deficiency of other specified B group vitamins: Secondary | ICD-10-CM | POA: Diagnosis not present

## 2018-04-07 DIAGNOSIS — E782 Mixed hyperlipidemia: Secondary | ICD-10-CM | POA: Diagnosis not present

## 2018-04-14 ENCOUNTER — Ambulatory Visit
Admission: RE | Admit: 2018-04-14 | Discharge: 2018-04-14 | Disposition: A | Discharge status: Home / Self Care | Payer: PPO | Source: Ambulatory Visit | Attending: Internal Medicine | Admitting: Internal Medicine

## 2018-04-14 DIAGNOSIS — Z1231 Encounter for screening mammogram for malignant neoplasm of breast: Secondary | ICD-10-CM | POA: Diagnosis not present

## 2018-04-16 DIAGNOSIS — E538 Deficiency of other specified B group vitamins: Secondary | ICD-10-CM | POA: Diagnosis not present

## 2018-05-01 DIAGNOSIS — Z85828 Personal history of other malignant neoplasm of skin: Secondary | ICD-10-CM | POA: Diagnosis not present

## 2018-05-01 DIAGNOSIS — D485 Neoplasm of uncertain behavior of skin: Secondary | ICD-10-CM | POA: Diagnosis not present

## 2018-05-01 DIAGNOSIS — L570 Actinic keratosis: Secondary | ICD-10-CM | POA: Diagnosis not present

## 2018-05-01 DIAGNOSIS — D2239 Melanocytic nevi of other parts of face: Secondary | ICD-10-CM | POA: Diagnosis not present

## 2018-05-01 DIAGNOSIS — L309 Dermatitis, unspecified: Secondary | ICD-10-CM | POA: Diagnosis not present

## 2018-05-01 DIAGNOSIS — X32XXXA Exposure to sunlight, initial encounter: Secondary | ICD-10-CM | POA: Diagnosis not present

## 2018-05-01 DIAGNOSIS — C44319 Basal cell carcinoma of skin of other parts of face: Secondary | ICD-10-CM | POA: Diagnosis not present

## 2018-05-01 DIAGNOSIS — Z08 Encounter for follow-up examination after completed treatment for malignant neoplasm: Secondary | ICD-10-CM | POA: Diagnosis not present

## 2018-05-01 DIAGNOSIS — L281 Prurigo nodularis: Secondary | ICD-10-CM | POA: Diagnosis not present

## 2018-05-18 DIAGNOSIS — E538 Deficiency of other specified B group vitamins: Secondary | ICD-10-CM | POA: Diagnosis not present

## 2018-06-15 DIAGNOSIS — C44319 Basal cell carcinoma of skin of other parts of face: Secondary | ICD-10-CM | POA: Diagnosis not present

## 2018-06-18 DIAGNOSIS — E538 Deficiency of other specified B group vitamins: Secondary | ICD-10-CM | POA: Diagnosis not present

## 2018-07-20 DIAGNOSIS — E538 Deficiency of other specified B group vitamins: Secondary | ICD-10-CM | POA: Diagnosis not present

## 2018-12-30 ENCOUNTER — Other Ambulatory Visit: Payer: Self-pay | Admitting: Student

## 2018-12-30 DIAGNOSIS — R1310 Dysphagia, unspecified: Secondary | ICD-10-CM

## 2018-12-30 DIAGNOSIS — K219 Gastro-esophageal reflux disease without esophagitis: Secondary | ICD-10-CM

## 2019-01-05 ENCOUNTER — Other Ambulatory Visit: Payer: Self-pay | Admitting: Student

## 2019-01-05 ENCOUNTER — Ambulatory Visit
Admission: RE | Admit: 2019-01-05 | Discharge: 2019-01-05 | Disposition: A | Discharge status: Home / Self Care | Payer: Medicare Other | Source: Ambulatory Visit | Attending: Student | Admitting: Student

## 2019-01-05 ENCOUNTER — Other Ambulatory Visit: Payer: Self-pay

## 2019-01-05 DIAGNOSIS — R1310 Dysphagia, unspecified: Secondary | ICD-10-CM | POA: Insufficient documentation

## 2019-01-05 DIAGNOSIS — K219 Gastro-esophageal reflux disease without esophagitis: Secondary | ICD-10-CM | POA: Insufficient documentation

## 2019-02-04 DIAGNOSIS — K222 Esophageal obstruction: Secondary | ICD-10-CM | POA: Insufficient documentation

## 2019-02-04 DIAGNOSIS — K2970 Gastritis, unspecified, without bleeding: Secondary | ICD-10-CM | POA: Insufficient documentation

## 2019-04-08 ENCOUNTER — Other Ambulatory Visit: Payer: Self-pay | Admitting: Internal Medicine

## 2019-04-08 DIAGNOSIS — Z1231 Encounter for screening mammogram for malignant neoplasm of breast: Secondary | ICD-10-CM

## 2019-04-12 DIAGNOSIS — F330 Major depressive disorder, recurrent, mild: Secondary | ICD-10-CM | POA: Insufficient documentation

## 2019-04-26 ENCOUNTER — Other Ambulatory Visit: Payer: Self-pay

## 2019-04-26 DIAGNOSIS — Z20822 Contact with and (suspected) exposure to covid-19: Secondary | ICD-10-CM

## 2019-04-27 LAB — NOVEL CORONAVIRUS, NAA: SARS-CoV-2, NAA: NOT DETECTED

## 2019-06-01 ENCOUNTER — Ambulatory Visit
Admission: RE | Admit: 2019-06-01 | Discharge: 2019-06-01 | Disposition: A | Discharge status: Home / Self Care | Payer: Medicare Other | Source: Ambulatory Visit | Attending: Internal Medicine | Admitting: Internal Medicine

## 2019-06-01 DIAGNOSIS — Z1231 Encounter for screening mammogram for malignant neoplasm of breast: Secondary | ICD-10-CM | POA: Diagnosis present

## 2020-04-13 DIAGNOSIS — J449 Chronic obstructive pulmonary disease, unspecified: Secondary | ICD-10-CM | POA: Insufficient documentation

## 2020-05-17 ENCOUNTER — Other Ambulatory Visit: Payer: Self-pay | Admitting: Internal Medicine

## 2020-05-17 DIAGNOSIS — Z1231 Encounter for screening mammogram for malignant neoplasm of breast: Secondary | ICD-10-CM

## 2020-06-14 ENCOUNTER — Ambulatory Visit
Admission: RE | Admit: 2020-06-14 | Discharge: 2020-06-14 | Disposition: A | Discharge status: Home / Self Care | Payer: Medicare Other | Source: Ambulatory Visit | Attending: Internal Medicine | Admitting: Internal Medicine

## 2020-06-14 ENCOUNTER — Other Ambulatory Visit: Payer: Self-pay

## 2020-06-14 DIAGNOSIS — Z1231 Encounter for screening mammogram for malignant neoplasm of breast: Secondary | ICD-10-CM | POA: Diagnosis present

## 2020-09-21 ENCOUNTER — Other Ambulatory Visit: Payer: Self-pay | Admitting: Internal Medicine

## 2020-09-21 DIAGNOSIS — J8410 Pulmonary fibrosis, unspecified: Secondary | ICD-10-CM

## 2020-09-21 DIAGNOSIS — K219 Gastro-esophageal reflux disease without esophagitis: Secondary | ICD-10-CM

## 2020-09-27 ENCOUNTER — Other Ambulatory Visit: Payer: Self-pay

## 2020-09-27 ENCOUNTER — Ambulatory Visit
Admission: RE | Admit: 2020-09-27 | Discharge: 2020-09-27 | Disposition: A | Discharge status: Home / Self Care | Payer: Medicare Other | Source: Ambulatory Visit | Attending: Internal Medicine | Admitting: Internal Medicine

## 2020-09-27 DIAGNOSIS — J8410 Pulmonary fibrosis, unspecified: Secondary | ICD-10-CM | POA: Diagnosis present

## 2020-09-27 DIAGNOSIS — K219 Gastro-esophageal reflux disease without esophagitis: Secondary | ICD-10-CM | POA: Insufficient documentation

## 2020-11-13 ENCOUNTER — Ambulatory Visit: Payer: Medicare Other | Admitting: Surgery

## 2020-11-13 ENCOUNTER — Other Ambulatory Visit: Payer: Self-pay

## 2020-11-13 ENCOUNTER — Encounter: Payer: Self-pay | Admitting: Surgery

## 2020-11-13 VITALS — BP 155/90 | HR 85 | Temp 99.0°F | Ht 63.0 in | Wt 160.2 lb

## 2020-11-13 DIAGNOSIS — K449 Diaphragmatic hernia without obstruction or gangrene: Secondary | ICD-10-CM

## 2020-11-13 NOTE — Patient Instructions (Addendum)
Referral sent to [facility]. Someone from their office will call you within 5-7 days to schedule an appointment. If you do not haer from their office place acll and let us know.  Please see your follow up appointment listed below.     Eating Plan After Nissen Fundoplication After a Nissen fundoplication procedure, it is common to have some difficulty swallowing. The part of your body that moves food and liquid from your mouth to your stomach (esophagus) will be swollen and may feel tight. It will take several weeks or months for your esophagus and stomach to heal. By following a special eating plan, you can prevent problems such as pain, swelling or pressure in the abdomen (bloating), gas, nausea, or diarrhea. What are tips for following this plan? Cooking  Cook all foods until they are soft.  Remove skins and seeds from fruits and vegetables before eating.  Remove skin and gristle from meats. Grind or finely mince meats before eating.  Avoid over-cooking meat. Dry, tough meat is more difficult to swallow.  Avoid using oil when cooking, or use only a small amount of oil.  Avoid using seasoning when cooking, or use only a small amount of seasoning.  Toast bread before eating. This makes it easier to swallow. Meal planning  Eat 6-8 small meals throughout the day.  Right after the surgery, have a few meals that are only clear liquids. Clear liquids include: ? Water. ? Clear fruit juice, no pulp. ? Chicken, beef, or vegetable broth. ? Gelatin. ? Decaffeinated tea or coffee without milk. ? Popsicles or shaved ice.  Depending on your progress, you may move to a full liquid diet as told by your health care provider. This includes clear liquids and the following: ? Dairy and alternative milks, such as soy milk. ? Strained creamed soups. ? Ice cream or sherbet. ? Pudding. ? Nutritional supplement drinks. ? Yogurt.  A few days after surgery, you may be able to start  eating a diet of soft foods. You may need to eat according to this plan for several weeks.  Do not eat sweets or sweetened drinks at the beginning of a meal. Doing that may cause your stomach to empty faster than it should (dumping syndrome).   Lifestyle  Always sit upright when eating or drinking.  Eat slowly. Take small bites and chew food well before swallowing.  Do not lie down after eating. Stay sitting up for 30 minutes or longer after each meal.  Sip fluids between meals.  Limit how much you drink at one time. With meals and snacks, have 4-8 oz (120-240 mL). This is equal to  cup-1 cup.  Do not mix solid foods and liquids in the same mouthful.  Drink enough fluid to keep your urine pale yellow.  Do not chew gum or drink fluids through a straw. Doing those things may cause you to swallow extra air. General information  Do not drink carbonated drinks or alcohol.  Avoid foods and drinks that contain caffeine and chocolate.  Avoid foods and drinks that contain citrus or tomato.  Allow hot soups and drinks to cool before eating.  Avoid foods that cause gas, such as beans, peas, broccoli, or cabbage.  If dairy milk products cause diarrhea, avoid them or eat them in small amounts. Recommended foods Fruits Any soft-cooked fruits after skins and seeds are removed. Fruit juice. Vegetables Any soft-cooked vegetables after skins and seeds are removed. Vegetable juice. Grains  Cooked cereals. Dry cereals softened  with liquid. Cooked pasta, rice, or other grains. Toasted bread. Bland crackers, such as soda or graham crackers. Meats and other protein foods  Tender cuts of meat, poultry, or fish after bones, skin, and gristle are removed. Poached, boiled, or scrambled eggs. Canned fish. Tofu. Creamy nut butters. Dairy  Milk. Yogurt. Cottage cheese. Mild cheeses. Beverages  Nutritional supplement drinks. Decaffeinated tea or coffee. Sports drinks. Fats and oils  Butter.  Margarine. Mayonnaise. Vegetable oil. Smooth salad dressing. Sweets and desserts  Plain hard candy. Marshmallows. Pudding. Ice cream. Gelatin. Sherbet. Seasoning and other foods  Salt. Light seasonings. Mustard. Vinegar. The items listed above may not be a complete list of recommended foods and beverages. Contact a dietitian for more information. Foods to avoid Fruits Oranges. Grapefruit. Lemons. Limes. Citrus juices. Dried fruit. Crunchy, raw fruits. Vegetables Tomato sauce. Tomato juice. Broccoli. Cauliflower. Cabbage. Brussels sprouts. Crunchy, raw vegetables. Grains  High-fiber or bran cereal. Cereal with nuts, dried fruit, or coconut. Sweet breads, rolls, coffee cake, or donuts. Chewy or crusty breads. Popcorn. Meats and other protein foods  Beans, peas, and lentils. Tough or fatty meats. Fried meats, chicken, or fish. Fried eggs. Nuts and seeds. Crunchy nut butters. Dairy  Chocolate milk. Yogurt with chunks of fruit, nuts, seeds, or coconut. Strong cheeses. Beverages  Carbonated soft drinks. Alcohol. Cocoa. Hot drinks. Fats and oils  Bacon fat. Lard. Sweets and desserts  Chocolate. Candy with nuts, coconut, or seeds. Peppermint. Cookies. Cakes. Pie crust. Seasoning and other foods  Heavy seasonings. Chili sauce. Ketchup. Barbecue sauce. Angie Fava. Horseradish. The items listed above may not be a complete list of foods and beverages to avoid. Contact a dietitian for more information. Summary  Following this eating plan after a Nissen fundoplication is an important part of healing after surgery.  After surgery, you will start with a clear liquid diet before you progress to full liquids and soft foods. You may need to eat soft foods for several weeks.  Avoid eating foods that cause irritation, gas, nausea, diarrhea, or swelling or pressure in the abdomen (bloating), and avoid foods that are difficult to swallow.  Talk with a dietitian about which dietary choices are best  for you. This information is not intended to replace advice given to you by your health care provider. Make sure you discuss any questions you have with your health care provider. Document Revised: 02/13/2020 Document Reviewed: 02/13/2020 Elsevier Patient Education  2021 [place].  Laparoscopic Nissen Fundoplication  Laparoscopic Nissen fundoplication is a surgery to relieve heartburn and other problems caused by fluid from your stomach (gastric fluids) flowing up into your esophagus. The esophagus is the part of the body that moves food from the mouth to the stomach. Normally, the muscle that sits between the stomach and the esophagus (lower esophageal sphincter, LES) keeps stomach fluids in the stomach. In some people, the LES does not work properly, and stomach fluids flow up into the esophagus (reflux). This can happen when part of the stomach bulges through the LES (hiatal hernia). The backward flow of stomach fluids can cause a type of severe and long-lasting heartburn that is called gastroesophageal reflux disease (GERD). You may need this surgery if other treatments for GERD have not helped. In this procedure, the upper part of your stomach is wrapped around the lower end of your esophagus and stitched together (sutured). This tightens the connection between your esophagus and stomach to prevent stomach acid reflux. Tell a health care provider about:  Any allergies you have.  All medicines you are taking, including vitamins, herbs, eye drops, creams, and over-the-counter medicines.  Any problems you or family members have had with anesthetic medicines.  Any blood disorders you have.  Any surgeries you have had.  Any medical conditions you have.  Whether you are pregnant or may be pregnant. What are the risks? Generally, this is a safe procedure. However, problems may occur, including:  Infection.  Bleeding.  Damage to other structures or organs. This can include damage to  the lung, causing a collapsed lung.  Trouble swallowing (dysphagia).  Blood clots.  Allergic reactions to medicines. What happens before the procedure? Staying hydrated  Follow instructions from your health care provider about hydration, which may include: ? Up to two hours before the procedure - you may continue to drink clear liquids, such as water, clear fruit juice, black coffee and plain tea. Eating and drinking restrictions  Follow instructions from your health care provider about eating and drinking, which may include: ? 8 hours before the procedure - stop eating heavy meals or foods, such as meat, fried foods, or fatty foods. ? 6 hours before the procedure - stop eating light meals or foods, such as toast or cereal. ? 6 hours before the procedure - stop drinking milk or drinks that contain milk. ? 2 hours before the procedure - stop drinking clear liquids. Medicines  Ask your health care provider about: ? Changing or stopping your regular medicines. This is especially important if you are taking diabetes medicines or blood thinners. ? Taking medicines such as aspirin and ibuprofen. These medicines can thin your blood. Do not take these medicines unless your health care provider tells you to take them. ? Taking over-the-counter medicines, vitamins, herbs, and supplements. Tests Your health care provider will do tests to plan the procedure. This may include:  An exam using a flexible scope passed down your esophagus into your stomach (endoscopy).  Imaging studies. General instructions  Plan to have a responsible adult take you home from the hospital or clinic.  Ask your health care provider: ? How your surgery site will be marked. ? What steps will be taken to help prevent infection. These steps may include:  Removing hair at the surgery site.  Washing skin with a germ-killing soap.  Taking antibiotic medicine.  Do not use any products that contain nicotine or  tobacco for at least 4 weeks before the procedure. These products include cigarettes, chewing tobacco, and vaping devices, such as e-cigarettes. If you need help quitting, ask your health care provider. What happens during the procedure?  An IV will be inserted into one of your veins.  You will be given medicine in your IV to help you relax (sedative) just before the procedure and a medicine to make you fall asleep (general anesthetic).  You may have a tube placed through your nose into your stomach to drain stomach acid during the procedure (nasogastric tube).  The surgeon will make a small incision in your abdomen and insert a tube through the incision.  Your abdomen will be filled with a gas. This helps the surgeon see your organs better, and it makes more space to work.  The surgeon will insert a thin, lighted tube (laparoscope) through the small incision. This allows your surgeon to see into your abdomen.  The surgeon will make several other small incisions in your abdomen to insert the other instruments that are needed during the procedure.  Another instrument (dilator) will be passed through your  mouth and down your esophagus into the upper part of your stomach. The dilator will prevent your LES from being closed too tightly during surgery.  The upper part of your stomach will be wrapped around the lower part of your esophagus and will be stitched into place. This will strengthen the lower esophageal sphincter and prevent reflux.  If you have a hiatal hernia, it will be repaired.  The gas will be released from your abdomen.  All instruments will be removed, and the incisions will be closed with stitches (sutures).  A bandage (dressing) will be placed on your skin over the incisions. The procedure may vary among health care providers and hospitals. What happens after the procedure?  Your blood pressure, heart rate, breathing rate, and blood oxygen level will be monitored until  you leave the hospital or clinic.  You will be given pain medicine as needed.  Your IV will be kept in until you are able to drink fluids.  You will be encouraged to get up and walk around as soon as possible. Summary  Laparoscopic Nissen fundoplication is a surgery to relieve heartburn and other problems caused by gastric fluids flowing up into your esophagus.  You may need this surgery if other treatments for GERD have not helped.  Follow instructions from your health care provider about eating and drinking before the procedure.  Your surgeon will use a thin, lighted tube (laparoscope) that is inserted through a small incision, allowing the surgeon to see into your abdomen. This information is not intended to replace advice given to you by your health care provider. Make sure you discuss any questions you have with your health care provider. Document Revised: 02/13/2020 Document Reviewed: 02/13/2020 Elsevier Patient Education  [place].

## 2020-11-14 ENCOUNTER — Encounter: Payer: Self-pay | Admitting: *Deleted

## 2020-11-15 ENCOUNTER — Telehealth: Payer: Self-pay

## 2020-11-15 ENCOUNTER — Encounter: Payer: Self-pay | Admitting: Surgery

## 2020-11-15 NOTE — Progress Notes (Signed)
Patient ID: Ruth Grant, female   DOB: 07-May-1951, 70 y.o.   MRN: [id number]  HPI Ruth Grant is a 70 y.o. female seen in consultation at the request of Dr. Lanney Gins . Does have a history of pulmonary fibrosis as well as longstanding GERD he has been on Prilosec with only partial relief of symptoms. Re Ports that his symptoms are worse when she lays down.  Does have chronic cough She has never smoked.  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  There is no evidence of a stricture.  Seems to be no evidence of esophageal motility disorders. She Does have a history of a tubal ligation  And cholecystectomy in the past. SHe did have an upper endoscopy in 2020 showing evidence of Schatzki ring that was dilated. Currently there is no evidence of significant dysphagia. Does have some dyspnea on exertion.  CBC and CMP were completely normal other than  Alkalosis HCO3 34   HPI  Past Medical History:  Diagnosis Date  . Allergic rhinitis   . Esophageal reflux   . Herpes zoster   . Hyperlipidemia   . Neuralgia   . Osteoporosis   . Postmenopausal state     Past Surgical History:  Procedure Laterality Date  . CHOLECYSTECTOMY    . COLONOSCOPY WITH PROPOFOL N/A 05/31/2016   Procedure: COLONOSCOPY WITH PROPOFOL;  Surgeon: Manya Silvas, MD;  Location: [facility] ENDOSCOPY;  Service: Endoscopy;  Laterality: N/A;  . TUBAL LIGATION      Family History  Problem Relation Age of Onset  . Breast cancer Sister 45  . Coronary artery disease Mother   . Hypertension Mother   . Hyperlipidemia Mother   . Asthma Sister   . Breast cancer Sister 47  . Diabetes Sister   . Breast cancer Sister 30  . Emphysema Father        smoked  . Emphysema Brother        smoked  . Emphysema Sister        smoked  . Emphysema Sister        never a smoker     Social History Social History   Tobacco Use  . Smoking status: Never Smoker  . Smokeless tobacco: Never Used  Substance Use Topics  . Alcohol use: Yes    Alcohol/week: 0.0 standard drinks    Comment: wine twice per month  . Drug use: No    Allergies  Allergen Reactions  . Ace Inhibitors Cough    Current Outpatient Medications  Medication Sig Dispense Refill  . aspirin 81 MG tablet Take 81 mg by mouth daily.    . cholecalciferol (VITAMIN D) 1000 UNITS tablet Take 1,000 Units by mouth daily.    . fluticasone (FLONASE) 50 MCG/ACT nasal spray Place into both nostrils daily.    Marland Kitchen levocetirizine (XYZAL) 5 MG tablet Take 5 mg by mouth every evening.    . raloxifene (EVISTA) 60 MG tablet Take 60 mg by mouth daily.    . sertraline (ZOLOFT) 50 MG tablet Take 50 mg by mouth daily.    . simvastatin (ZOCOR) 40 MG tablet Take 40 mg by mouth at bedtime.    . budesonide-formoterol (SYMBICORT) 160-4.5 MCG/ACT inhaler Take 2 puffs first thing in am and then another 2 puffs about 12 hours later. 1 Inhaler 11  . famotidine (PEPCID) 20 MG tablet One at bedtime (Patient  not taking: Reported on 05/31/2016)    . omeprazole-sodium bicarbonate (ZEGERID) 40-1100 MG per capsule Take 30-60 min before first meal of the day     No current facility-administered medications for this visit.     Review of Systems Full ROS  was asked and was negative except for the information on the HPI  Physical Exam Blood pressure (!) 155/90, pulse 85, temperature 99 F (37.2 C), temperature source Oral, height 5\' 3"  (1.6 m), weight 160 lb 3.2 oz (72.7 kg), SpO2 96 %. CONSTITUTIONAL: NAD EYES: Pupils are equal, round,  Sclera are non-icteric. EARS, NOSE, MOUTH AND THROAT: She is wearing a mask, hearing is intact to voice. LYMPH NODES:  Lymph nodes in the neck are normal. RESPIRATORY:  Lungs are clear. There is normal respiratory effort, with equal breath sounds bilaterally, and without pathologic use of accessory  muscles. CARDIOVASCULAR: Heart is regular without murmurs, gallops, or rubs. GI: The abdomen is soft, nontender, and nondistended. There are no palpable masses. There is no hepatosplenomegaly. There are normal bowel sounds in all quadrants. GU: Rectal deferred.   MUSCULOSKELETAL: Normal muscle strength and tone. No cyanosis or edema.   SKIN: Turgor is good and there are no pathologic skin lesions or ulcers. NEUROLOGIC: Motor and sensation is grossly normal. Cranial nerves are grossly intact. PSYCH:  Oriented to person, place and time. Affect is normal.  Data Reviewed  I have personally reviewed the patient's imaging, laboratory findings and medical records.    Assessment/Plan To large paraesophageal hernia type III causing significant cough and pulmonary symptoms in a patient with pulmonary fibrosis and contributing to her respiratory symptoms.  Discussed with patient in detail.  I Do think that she will be a reasonable candidate for robotic approach.  Before we proceed with any planning of any surgical intervention I would like her to get an upper endoscopy to make sure that she does not require any dilation and does not have any endoluminal lesions. Discussed with her in detail about the implications of robotic repair.  Postoperative course.  Risk, benefits and possible applications.  She understands and wishes to move forward with work-up. Time spent with the patient was 65 minutes, with more than 50% of the time spent in face-to-face education, counseling and care coordination.     Caroleen Hamman, MD FACS General Surgeon 11/15/2020, 3:20 PM

## 2020-11-15 NOTE — Telephone Encounter (Signed)
Left vm for pt to return my call to schedule EGD.

## 2020-11-16 ENCOUNTER — Other Ambulatory Visit: Payer: Self-pay

## 2020-11-16 DIAGNOSIS — K449 Diaphragmatic hernia without obstruction or gangrene: Secondary | ICD-10-CM

## 2020-11-28 ENCOUNTER — Encounter: Payer: Self-pay | Admitting: Gastroenterology

## 2020-11-28 ENCOUNTER — Ambulatory Visit
Admission: RE | Admit: 2020-11-28 | Discharge: 2020-11-28 | Disposition: A | Discharge status: Home / Self Care | Payer: Medicare Other | Attending: Gastroenterology | Admitting: Gastroenterology

## 2020-11-28 ENCOUNTER — Encounter
Admission: RE | Disposition: A | Discharge status: Home / Self Care | Payer: Self-pay | Source: Home / Self Care | Attending: Gastroenterology

## 2020-11-28 ENCOUNTER — Ambulatory Visit: Payer: Medicare Other | Admitting: Registered Nurse

## 2020-11-28 DIAGNOSIS — K449 Diaphragmatic hernia without obstruction or gangrene: Secondary | ICD-10-CM

## 2020-11-28 DIAGNOSIS — Z825 Family history of asthma and other chronic lower respiratory diseases: Secondary | ICD-10-CM | POA: Diagnosis not present

## 2020-11-28 DIAGNOSIS — K219 Gastro-esophageal reflux disease without esophagitis: Secondary | ICD-10-CM

## 2020-11-28 DIAGNOSIS — Z8349 Family history of other endocrine, nutritional and metabolic diseases: Secondary | ICD-10-CM | POA: Insufficient documentation

## 2020-11-28 DIAGNOSIS — Z7951 Long term (current) use of inhaled steroids: Secondary | ICD-10-CM | POA: Diagnosis not present

## 2020-11-28 DIAGNOSIS — Z833 Family history of diabetes mellitus: Secondary | ICD-10-CM | POA: Insufficient documentation

## 2020-11-28 DIAGNOSIS — Z8249 Family history of ischemic heart disease and other diseases of the circulatory system: Secondary | ICD-10-CM | POA: Diagnosis not present

## 2020-11-28 DIAGNOSIS — Z803 Family history of malignant neoplasm of breast: Secondary | ICD-10-CM | POA: Insufficient documentation

## 2020-11-28 DIAGNOSIS — Z7982 Long term (current) use of aspirin: Secondary | ICD-10-CM | POA: Diagnosis not present

## 2020-11-28 DIAGNOSIS — Z888 Allergy status to other drugs, medicaments and biological substances status: Secondary | ICD-10-CM | POA: Diagnosis not present

## 2020-11-28 HISTORY — PX: ESOPHAGOGASTRODUODENOSCOPY (EGD) WITH PROPOFOL: SHX5813

## 2020-11-28 SURGERY — ESOPHAGOGASTRODUODENOSCOPY (EGD) WITH PROPOFOL
Anesthesia: General

## 2020-11-28 MED ORDER — GLYCOPYRROLATE 0.2 MG/ML IJ SOLN
INTRAMUSCULAR | Status: DC | PRN
  Administered 2020-11-28: .1 mg via INTRAVENOUS

## 2020-11-28 MED ORDER — SODIUM CHLORIDE 0.9 % IV SOLN
INTRAVENOUS | Status: DC
  Administered 2020-11-28: 1000 mL via INTRAVENOUS

## 2020-11-28 MED ORDER — PROPOFOL 500 MG/50ML IV EMUL
INTRAVENOUS | Status: DC | PRN
  Administered 2020-11-28: 150 ug/kg/min via INTRAVENOUS

## 2020-11-28 MED ORDER — PROPOFOL 10 MG/ML IV BOLUS
INTRAVENOUS | Status: DC | PRN
  Administered 2020-11-28: 70 mg via INTRAVENOUS

## 2020-11-28 MED ORDER — GLYCOPYRROLATE 0.2 MG/ML IJ SOLN
INTRAMUSCULAR | Status: AC
  Filled 2020-11-28: qty 1

## 2020-11-28 MED ORDER — DEXMEDETOMIDINE HCL 200 MCG/2ML IV SOLN
INTRAVENOUS | Status: DC | PRN
  Administered 2020-11-28: 12 ug via INTRAVENOUS

## 2020-11-28 MED ORDER — LIDOCAINE HCL (CARDIAC) PF 100 MG/5ML IV SOSY
PREFILLED_SYRINGE | INTRAVENOUS | Status: DC | PRN
  Administered 2020-11-28: 100 mg via INTRAVENOUS

## 2020-11-28 MED ORDER — DEXMEDETOMIDINE (PRECEDEX) IN NS 20 MCG/5ML (4 MCG/ML) IV SYRINGE
PREFILLED_SYRINGE | INTRAVENOUS | Status: AC
  Filled 2020-11-28: qty 5

## 2020-11-28 NOTE — Anesthesia Postprocedure Evaluation (Signed)
Anesthesia Post Note  Patient: Ruth Grant  Procedure(s) Performed: ESOPHAGOGASTRODUODENOSCOPY (EGD) WITH PROPOFOL (N/A )  Patient location during evaluation: PACU Anesthesia Type: General Level of consciousness: awake and alert Pain management: pain level controlled Vital Signs Assessment: post-procedure vital signs reviewed and stable Respiratory status: spontaneous breathing, nonlabored ventilation and respiratory function stable Cardiovascular status: blood pressure returned to baseline and stable Postop Assessment: no apparent nausea or vomiting Anesthetic complications: no   No complications documented.   Last Vitals:  Vitals:   11/28/20 0911 11/28/20 0944  BP: (!) 151/83   Pulse: 79   Resp: 16   Temp: (!) 36.4 C (!) 36.3 C  SpO2: 96%     Last Pain:  Vitals:   11/28/20 0954  TempSrc:   PainSc: 0-No pain                 Tera Mater

## 2020-11-28 NOTE — Op Note (Signed)
[facility] Patient Name: Ruth Grant Procedure Date: 11/28/2020 9:31 AM MRN: [id number] Account #: [id number] Date of Birth: [birth year] Admit Type: Outpatient Age: 70 Room: [facility] ENDO ROOM 4 Gender: Female Note Status: Finalized Procedure:             Upper GI endoscopy Indications:           Gastro-esophageal reflux disease, Preoperative                         assessment Providers:             Lucilla Lame MD, MD Referring MD:          Rusty Aus, MD (Referring MD) Medicines:             Propofol per Anesthesia Complications:         No immediate complications. Procedure:             Pre-Anesthesia Assessment:                        - Prior to the procedure, a History and Physical was                         performed, and patient medications and allergies were                         reviewed. The patient's tolerance of previous                         anesthesia was also reviewed. The risks and benefits                         of the procedure and the sedation options and risks                         were discussed with the patient. All questions were                         answered, and informed consent was obtained. Prior                         Anticoagulants: The patient has taken no previous                         anticoagulant or antiplatelet agents. ASA Grade                         Assessment: II - A patient with mild systemic disease.                         After reviewing the risks and benefits, the patient                         was deemed in satisfactory condition to undergo the                         procedure.                        After  obtaining informed consent, the endoscope was                         passed under direct vision. Throughout the procedure,                         the patient's blood pressure, pulse, and oxygen                         saturations were monitored continuously. The Endoscope                          was introduced through the mouth, and advanced to the                         second part of duodenum. The upper GI endoscopy was                         accomplished without difficulty. The patient tolerated                         the procedure well. Findings:      A 5 cm hiatal hernia was present from 30cm to 35cm .      The stomach was normal.      The examined duodenum was normal. Impression:            - 5 cm hiatal hernia.                        - Normal stomach.                        - Normal examined duodenum.                        - No specimens collected. Recommendation:        - Discharge patient to home.                        - Resume previous diet.                        - Continue present medications.                        - Return to referring physician. Procedure Code(s):     --- Professional ---                        [phone], Esophagogastroduodenoscopy, flexible,                         transoral; diagnostic, including collection of                         specimen(s) by brushing or washing, when performed                         (separate procedure) Diagnosis Code(s):     --- Professional ---  Z01.818, Encounter for other preprocedural examination                        K21.9, Gastro-esophageal reflux disease without                         esophagitis CPT copyright 2019 American [employer] Association. All rights reserved. The codes documented in this report are preliminary and upon coder review may  be revised to meet current compliance requirements. Lucilla Lame MD, MD 11/28/2020 9:41:09 AM This report has been signed electronically. Number of Addenda: 0 Note Initiated On: 11/28/2020 9:31 AM Estimated Blood Loss:  Estimated blood loss: none.      [facility]

## 2020-11-28 NOTE — H&P (Signed)
Lucilla Lame, MD [address] [place], [place] [address] Phone:[phone] Fax : [phone]  Primary Care Physician:  Rusty Aus, MD Primary Gastroenterologist:  Dr. Allen Norris  Pre-Procedure History & Physical: HPI:  Ruth Grant is a 70 y.o. female is here for an endoscopy.   Past Medical History:  Diagnosis Date  . Allergic rhinitis   . Esophageal reflux   . Herpes zoster   . Hyperlipidemia   . Neuralgia   . Osteoporosis   . Postmenopausal state     Past Surgical History:  Procedure Laterality Date  . CHOLECYSTECTOMY    . COLONOSCOPY WITH PROPOFOL N/A 05/31/2016   Procedure: COLONOSCOPY WITH PROPOFOL;  Surgeon: Manya Silvas, MD;  Location: [facility] ENDOSCOPY;  Service: Endoscopy;  Laterality: N/A;  . TUBAL LIGATION      Prior to Admission medications   Medication Sig Start Date End Date Taking? Authorizing Provider  aspirin 81 MG tablet Take 81 mg by mouth daily.   Yes [provider]  cholecalciferol (VITAMIN D) 1000 UNITS tablet Take 1,000 Units by mouth daily.   Yes [provider]  fluticasone (FLONASE) 50 MCG/ACT nasal spray Place into both nostrils daily.   Yes [provider]  levocetirizine (XYZAL) 5 MG tablet Take 5 mg by mouth every evening.   Yes [provider]  raloxifene (EVISTA) 60 MG tablet Take 60 mg by mouth daily.   Yes [provider]  sertraline (ZOLOFT) 50 MG tablet Take 50 mg by mouth daily.   Yes [provider]  simvastatin (ZOCOR) 40 MG tablet Take 40 mg by mouth at bedtime.   Yes [provider]  budesonide-formoterol (SYMBICORT) 160-4.5 MCG/ACT inhaler Take 2 puffs first thing in am and then another 2 puffs about 12 hours later. 07/24/12   Tanda Rockers, MD  famotidine (PEPCID) 20 MG tablet One at bedtime Patient not taking: Reported on 05/31/2016 02/18/12 02/17/13  Tanda Rockers, MD  omeprazole-sodium bicarbonate (ZEGERID) 40-1100 MG per capsule Take 30-60 min  before first meal of the day 02/18/12 05/31/16  Tanda Rockers, MD    Allergies as of 11/16/2020 - Review Complete 11/15/2020  Allergen Reaction Noted  . Ace inhibitors Cough 02/14/2012    Family History  Problem Relation Age of Onset  . Breast cancer Sister 43  . Coronary artery disease Mother   . Hypertension Mother   . Hyperlipidemia Mother   . Asthma Sister   . Breast cancer Sister 50  . Diabetes Sister   . Breast cancer Sister 55  . Emphysema Father        smoked  . Emphysema Brother        smoked  . Emphysema Sister        smoked  . Emphysema Sister        never a smoker    Social History   Socioeconomic History  . Marital status: Married    Spouse name: Not on file  . Number of children: 2  . Years of education: Not on file  . Highest education level: Not on file  Occupational History  . Occupation: vice Ecologist: [employer]  Tobacco Use  . Smoking status: Never Smoker  . Smokeless tobacco: Never Used  Vaping Use  . Vaping Use: Never used  Substance and Sexual Activity  . Alcohol use: Yes    Alcohol/week: 0.0 standard drinks    Comment: wine twice per month  .  Drug use: No  . Sexual activity: Not on file  Other Topics Concern  . Not on file  Social History Narrative  . Not on file   Social Determinants of Health   Financial Resource Strain: Not on file  Food Insecurity: Not on file  Transportation Needs: Not on file  Physical Activity: Not on file  Stress: Not on file  Social Connections: Not on file  Intimate Partner Violence: Not on file    Review of Systems: See HPI, otherwise negative ROS  Physical Exam: BP (!) 151/83   Pulse 79   Temp (!) 97.5 F (36.4 C) (Temporal)   Resp 16   Ht 5\' 3"  (1.6 m)   Wt 71.4 kg   LMP  (LMP Unknown)   SpO2 96%   BMI 27.87 kg/m  General:   Alert,  pleasant and cooperative in NAD Head:  Normocephalic and atraumatic. Neck:  Supple; no masses or thyromegaly. Lungs:  Clear  throughout to auscultation.    Heart:  Regular rate and rhythm. Abdomen:  Soft, nontender and nondistended. Normal bowel sounds, without guarding, and without rebound.   Neurologic:  Alert and  oriented x4;  grossly normal neurologically.  Impression/Plan: Ruth Grant is here for an endoscopy to be performed for pre-op for hernia repair  Risks, benefits, limitations, and alternatives regarding  endoscopy have been reviewed with the patient.  Questions have been answered.  All parties agreeable.   Lucilla Lame, MD  11/28/2020, 9:20 AM

## 2020-11-28 NOTE — Transfer of Care (Signed)
Immediate Anesthesia Transfer of Care Note  Patient: Ruth Grant  Procedure(s) Performed: ESOPHAGOGASTRODUODENOSCOPY (EGD) WITH PROPOFOL (N/A )  Patient Location: Endoscopy Unit  Anesthesia Type:General  Level of Consciousness: drowsy  Airway & Oxygen Therapy: Patient Spontanous Breathing  Post-op Assessment: Report given to RN and Post -op Vital signs reviewed and stable  Post vital signs: Reviewed and stable  Last Vitals:  Vitals Value Taken Time  BP 137/74 11/28/20 0943  Temp 36.3 C 11/28/20 0944  Pulse 89 11/28/20 0947  Resp 16 11/28/20 0947  SpO2 98 % 11/28/20 0947  Vitals shown include unvalidated device data.  Last Pain:  Vitals:   11/28/20 0944  TempSrc: Temporal  PainSc: Asleep         Complications: No complications documented.

## 2020-11-28 NOTE — Anesthesia Preprocedure Evaluation (Signed)
Anesthesia Evaluation  Patient identified by MRN, date of birth, ID band Patient awake    Reviewed: Allergy & Precautions, H&P , NPO status , Patient's Chart, lab work & pertinent test results  History of Anesthesia Complications Negative for: history of anesthetic complications  Airway Mallampati: III  TM Distance: >3 FB Neck ROM: full    Dental  (+) Chipped   Pulmonary asthma , neg sleep apnea, COPD,    breath sounds clear to auscultation       Cardiovascular (-) angina(-) Past MI and (-) Cardiac Stents negative cardio ROS  (-) dysrhythmias  Rhythm:regular Rate:Normal     Neuro/Psych PSYCHIATRIC DISORDERS Depression negative neurological ROS     GI/Hepatic Neg liver ROS, GERD  Controlled,  Endo/Other  negative endocrine ROS  Renal/GU negative Renal ROS  negative genitourinary   Musculoskeletal   Abdominal   Peds  Hematology negative hematology ROS (+)   Anesthesia Other Findings Past Medical History: No date: Allergic rhinitis No date: Esophageal reflux No date: Herpes zoster No date: Hyperlipidemia No date: Neuralgia No date: Osteoporosis No date: Postmenopausal state  Past Surgical History: No date: CHOLECYSTECTOMY 05/31/2016: COLONOSCOPY WITH PROPOFOL; N/A     Comment:  Procedure: COLONOSCOPY WITH PROPOFOL;  Surgeon: Manya Silvas, MD;  Location: [place];  Service:               Endoscopy;  Laterality: N/A; No date: TUBAL LIGATION  BMI    Body Mass Index: 27.87 kg/m      Reproductive/Obstetrics negative OB ROS                             Anesthesia Physical Anesthesia Plan  ASA: II  Anesthesia Plan: General   Post-op Pain Management:    Induction:   PONV Risk Score and Plan: Propofol infusion and TIVA  Airway Management Planned: Nasal Cannula  Additional Equipment:   Intra-op Plan:   Post-operative Plan:   Informed Consent: I  have reviewed the patients History and Physical, chart, labs and discussed the procedure including the risks, benefits and alternatives for the proposed anesthesia with the patient or authorized representative who has indicated his/her understanding and acceptance.     Dental Advisory Given  Plan Discussed with: Anesthesiologist, CRNA and Surgeon  Anesthesia Plan Comments:         Anesthesia Quick Evaluation

## 2020-11-29 ENCOUNTER — Encounter: Payer: Self-pay | Admitting: Gastroenterology

## 2020-12-13 ENCOUNTER — Ambulatory Visit: Payer: Self-pay | Admitting: Surgery

## 2021-01-01 ENCOUNTER — Ambulatory Visit: Payer: Medicare Other | Admitting: Gastroenterology

## 2021-01-10 ENCOUNTER — Encounter: Payer: Self-pay | Admitting: Surgery

## 2021-01-10 ENCOUNTER — Ambulatory Visit: Payer: Medicare Other | Admitting: Surgery

## 2021-01-10 ENCOUNTER — Other Ambulatory Visit: Payer: Self-pay

## 2021-01-10 VITALS — BP 138/86 | HR 84 | Temp 98.7°F | Ht 63.0 in | Wt 160.8 lb

## 2021-01-10 DIAGNOSIS — K449 Diaphragmatic hernia without obstruction or gangrene: Secondary | ICD-10-CM | POA: Diagnosis not present

## 2021-01-10 NOTE — H&P (View-Only) (Signed)
Outpatient Surgical Follow Up  01/10/2021  Ruth Grant is an 70 y.o. female.   Chief Complaint  Patient presents with  . Follow-up    Hiatal hernia EGD 11/28/20    HPI: 69 year old female well-known to me with a history of chronic bronchiectasis and obstructive pulmonary disease.  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  She continues to have cough and some reflux.  She underwent an EGD as well as a barium swallow and a CT scan of the abdomen pelvis.  Please note that I have reviewed all the above imaging studies.  There is evidence of a moderate sized hiatal hernia with about a third of the stomach within the mediastinum.  There is no evidence of complicating features. He denies any recent chest pain, any worsening shortness of breath or other acute illnesses.  She is ready to move on with her surgery  Past Medical History:  Diagnosis Date  . Allergic rhinitis   . Esophageal reflux   . Herpes zoster   . Hyperlipidemia   . Neuralgia   . Osteoporosis   . Postmenopausal state     Past Surgical History:  Procedure Laterality Date  . CHOLECYSTECTOMY    . COLONOSCOPY WITH PROPOFOL N/A 05/31/2016   Procedure: COLONOSCOPY WITH PROPOFOL;  Surgeon: Manya Silvas, MD;  Location: [facility] ENDOSCOPY;  Service: Endoscopy;  Laterality: N/A;  . ESOPHAGOGASTRODUODENOSCOPY (EGD) WITH PROPOFOL N/A 11/28/2020   Procedure: ESOPHAGOGASTRODUODENOSCOPY (EGD) WITH PROPOFOL;  Surgeon: Lucilla Lame, MD;  Location: [facility] ENDOSCOPY;  Service: Endoscopy;  Laterality: N/A;  . TUBAL LIGATION      Family History  Problem Relation Age of Onset  . Breast cancer Sister 4  . Coronary artery disease Mother   . Hypertension Mother   . Hyperlipidemia Mother   . Asthma Sister   . Breast cancer Sister 49  . Diabetes Sister   . Breast cancer Sister 69  . Emphysema Father        smoked  . Emphysema Brother        smoked  . Emphysema Sister        smoked  . Emphysema Sister         never a smoker    Social History:  reports that she has never smoked. She has never used smokeless tobacco. She reports current alcohol use. She reports that she does not use drugs.  Allergies:  Allergies  Allergen Reactions  . Ace Inhibitors Cough    Medications reviewed.    ROS Full ROS performed and is otherwise negative other than what is stated in HPI   BP 138/86   Pulse 84   Temp 98.7 F (37.1 C) (Oral)   Ht 5\' 3"  (1.6 m)   Wt 160 lb 12.8 oz (72.9 kg)   LMP  (LMP Unknown)   SpO2 95%   BMI 28.48 kg/m   Physical Exam Vitals and nursing note reviewed. Exam conducted with a chaperone present.  Constitutional:      General: She is not in acute distress.    Appearance: Normal appearance. She is normal weight.  Eyes:     General: No scleral icterus.       Right eye: No discharge.        Left eye: No discharge.  Cardiovascular:     Rate and Rhythm: Normal rate and regular rhythm.     Heart sounds: No murmur heard.   Pulmonary:     Effort: Pulmonary effort is  normal. No respiratory distress.     Breath sounds: Normal breath sounds. No stridor. No wheezing.  Abdominal:     General: Abdomen is flat. There is no distension.     Palpations: Abdomen is soft. There is no mass.     Tenderness: There is no abdominal tenderness. There is no guarding or rebound.     Hernia: No hernia is present.  Musculoskeletal:        General: No swelling. Normal range of motion.     Cervical back: Normal range of motion and neck supple. No rigidity or tenderness.  Skin:    General: Skin is warm and dry.     Capillary Refill: Capillary refill takes less than 2 seconds.  Neurological:     General: No focal deficit present.     Mental Status: She is alert and oriented to person, place, and time.  Psychiatric:        Mood and Affect: Mood normal.        Behavior: Behavior normal.        Thought Content: Thought content normal.        Judgment: Judgment normal.        Assessment/Plan: 70 year old female with a moderate hiatal hernia that is Badik and causing significant pulmonary issues.  I do think that is a wise idea to proceed with repair.  Procedure discussed with patient detail.  Risks, benefits and possible complications were discussed with patient in detail.  Bleeding, infection, esophageal injuries, chronic pain, bloating were discussed.  She understands and wishes to proceed.  She does have an upcoming beach vacation and wishes to perform the surgery towards the end of June.  We will certainly accommodate her request.  We will plan to perform robotic hiatal hernia repair with fundoplication in the next few weeks.  Greater than 50% of the 40 minutes  visit was spent in counseling/coordination of care   Caroleen Hamman, MD [place] Surgeon

## 2021-01-10 NOTE — Progress Notes (Signed)
Outpatient Surgical Follow Up  01/10/2021  Ruth Grant is an 70 y.o. female.   Chief Complaint  Patient presents with  . Follow-up    Hiatal hernia EGD 11/28/20    HPI: 70 year old female well-known to me with a history of chronic bronchiectasis and obstructive pulmonary disease.  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  She continues to have cough and some reflux.  She underwent an EGD as well as a barium swallow and a CT scan of the abdomen pelvis.  Please note that I have reviewed all the above imaging studies.  There is evidence of a moderate sized hiatal hernia with about a third of the stomach within the mediastinum.  There is no evidence of complicating features. He denies any recent chest pain, any worsening shortness of breath or other acute illnesses.  She is ready to move on with her surgery  Past Medical History:  Diagnosis Date  . Allergic rhinitis   . Esophageal reflux   . Herpes zoster   . Hyperlipidemia   . Neuralgia   . Osteoporosis   . Postmenopausal state     Past Surgical History:  Procedure Laterality Date  . CHOLECYSTECTOMY    . COLONOSCOPY WITH PROPOFOL N/A 05/31/2016   Procedure: COLONOSCOPY WITH PROPOFOL;  Surgeon: Manya Silvas, MD;  Location: [facility] ENDOSCOPY;  Service: Endoscopy;  Laterality: N/A;  . ESOPHAGOGASTRODUODENOSCOPY (EGD) WITH PROPOFOL N/A 11/28/2020   Procedure: ESOPHAGOGASTRODUODENOSCOPY (EGD) WITH PROPOFOL;  Surgeon: Lucilla Lame, MD;  Location: [facility] ENDOSCOPY;  Service: Endoscopy;  Laterality: N/A;  . TUBAL LIGATION      Family History  Problem Relation Age of Onset  . Breast cancer Sister 14  . Coronary artery disease Mother   . Hypertension Mother   . Hyperlipidemia Mother   . Asthma Sister   . Breast cancer Sister 1  . Diabetes Sister   . Breast cancer Sister 73  . Emphysema Father        smoked  . Emphysema Brother        smoked  . Emphysema Sister        smoked  . Emphysema Sister         never a smoker    Social History:  reports that she has never smoked. She has never used smokeless tobacco. She reports current alcohol use. She reports that she does not use drugs.  Allergies:  Allergies  Allergen Reactions  . Ace Inhibitors Cough    Medications reviewed.    ROS Full ROS performed and is otherwise negative other than what is stated in HPI   BP 138/86   Pulse 84   Temp 98.7 F (37.1 C) (Oral)   Ht 5\' 3"  (1.6 m)   Wt 160 lb 12.8 oz (72.9 kg)   LMP  (LMP Unknown)   SpO2 95%   BMI 28.48 kg/m   Physical Exam Vitals and nursing note reviewed. Exam conducted with a chaperone present.  Constitutional:      General: She is not in acute distress.    Appearance: Normal appearance. She is normal weight.  Eyes:     General: No scleral icterus.       Right eye: No discharge.        Left eye: No discharge.  Cardiovascular:     Rate and Rhythm: Normal rate and regular rhythm.     Heart sounds: No murmur heard.   Pulmonary:     Effort: Pulmonary effort is  normal. No respiratory distress.     Breath sounds: Normal breath sounds. No stridor. No wheezing.  Abdominal:     General: Abdomen is flat. There is no distension.     Palpations: Abdomen is soft. There is no mass.     Tenderness: There is no abdominal tenderness. There is no guarding or rebound.     Hernia: No hernia is present.  Musculoskeletal:        General: No swelling. Normal range of motion.     Cervical back: Normal range of motion and neck supple. No rigidity or tenderness.  Skin:    General: Skin is warm and dry.     Capillary Refill: Capillary refill takes less than 2 seconds.  Neurological:     General: No focal deficit present.     Mental Status: She is alert and oriented to person, place, and time.  Psychiatric:        Mood and Affect: Mood normal.        Behavior: Behavior normal.        Thought Content: Thought content normal.        Judgment: Judgment normal.        Assessment/Plan: 70 year old female with a moderate hiatal hernia that is Badik and causing significant pulmonary issues.  I do think that is a wise idea to proceed with repair.  Procedure discussed with patient detail.  Risks, benefits and possible complications were discussed with patient in detail.  Bleeding, infection, esophageal injuries, chronic pain, bloating were discussed.  She understands and wishes to proceed.  She does have an upcoming beach vacation and wishes to perform the surgery towards the end of June.  We will certainly accommodate her request.  We will plan to perform robotic hiatal hernia repair with fundoplication in the next few weeks.  Greater than 50% of the 40 minutes  visit was spent in counseling/coordination of care   Caroleen Hamman, MD [place] Surgeon

## 2021-01-10 NOTE — Patient Instructions (Addendum)
Our surgery scheduler Pamala Hurry will call you within 24-48 hours to get you scheduled. If you have not heard from her after 48 hours, please call our office. You will not need to get Covid tested before surgery and have the blue sheet available when she calls to write down important information.   If you have any concerns or questions, please feel free to call our office.    Hiatal Hernia  A hiatal hernia occurs when part of the stomach slides above the muscle that separates the abdomen from the chest (diaphragm). A person can be born with a hiatal hernia (congenital), or it may develop over time. In almost all cases of hiatal hernia, only the top part of the stomach pushes through the diaphragm. Many people have a hiatal hernia with no symptoms. The larger the hernia, the more likely it is that you will have symptoms. In some cases, a hiatal hernia allows stomach acid to flow back into the tube that carries food from your mouth to your stomach (esophagus). This may cause heartburn symptoms. Severe heartburn symptoms may mean that you have developed a condition called gastroesophageal reflux disease (GERD). What are the causes? This condition is caused by a weakness in the opening (hiatus) where the esophagus passes through the diaphragm to attach to the upper part of the stomach. A person may be born with a weakness in the hiatus, or a weakness can develop over time. What increases the risk? This condition is more likely to develop in:  Older people. Age is a major risk factor for a hiatal hernia, especially if you are over the age of 25.  Pregnant women.  People who are overweight.  People who have frequent constipation. What are the signs or symptoms? Symptoms of this condition usually develop in the form of GERD symptoms. Symptoms include:  Heartburn.  Belching.  Indigestion.  Trouble swallowing.  Coughing or wheezing.  Sore throat.  Hoarseness.  Chest pain.  Nausea and  vomiting. How is this diagnosed? This condition may be diagnosed during testing for GERD. Tests that may be done include:  X-rays of your stomach or chest.  An upper gastrointestinal (GI) series. This is an X-ray exam of your GI tract that is taken after you swallow a chalky liquid that shows up clearly on the X-ray.  Endoscopy. This is a procedure to look into your stomach using a thin, flexible tube that has a tiny camera and light on the end of it. How is this treated? This condition may be treated by:  Dietary and lifestyle changes to help reduce GERD symptoms.  Medicines. These may include: ? Over-the-counter antacids. ? Medicines that make your stomach empty more quickly. ? Medicines that block the production of stomach acid (H2 blockers). ? Stronger medicines to reduce stomach acid (proton pump inhibitors).  Surgery to repair the hernia, if other treatments are not helping. If you have no symptoms, you may not need treatment. Follow these instructions at home: Lifestyle and activity  Do not use any products that contain nicotine or tobacco, such as cigarettes and e-cigarettes. If you need help quitting, ask your health care provider.  Try to achieve and maintain a healthy body weight.  Avoid putting pressure on your abdomen. Anything that puts pressure on your abdomen increases the amount of acid that may be pushed up into your esophagus. ? Avoid bending over, especially after eating. ? Raise the head of your bed by putting blocks under the legs. This keeps your  head and esophagus higher than your stomach. ? Do not wear tight clothing around your chest or stomach. ? Try not to strain when having a bowel movement, when urinating, or when lifting heavy objects. Eating and drinking  Avoid foods that can worsen GERD symptoms. These may include: ? Fatty foods, like fried foods. ? Citrus fruits, like oranges or lemon. ? Other foods and drinks that contain acid, like orange  juice or tomatoes. ? Spicy food. ? Chocolate.  Eat frequent small meals instead of three large meals a day. This helps prevent your stomach from getting too full. ? Eat slowly. ? Do not lie down right after eating. ? Do not eat 1-2 hours before bed.  Do not drink beverages with caffeine. These include cola, coffee, cocoa, and tea.  Do not drink alcohol. General instructions  Take over-the-counter and prescription medicines only as told by your health care provider.  Keep all follow-up visits as told by your health care provider. This is important. Contact a health care provider if:  Your symptoms are not controlled with medicines or lifestyle changes.  You are having trouble swallowing.  You have coughing or wheezing that will not go away. Get help right away if:  Your pain is getting worse.  Your pain spreads to your arms, neck, jaw, teeth, or back.  You have shortness of breath.  You sweat for no reason.  You feel sick to your stomach (nauseous) or you vomit.  You vomit blood.  You have bright red blood in your stools.  You have black, tarry stools. This information is not intended to replace advice given to you by your health care provider. Make sure you discuss any questions you have with your health care provider. Document Revised: 07/11/2017 Document Reviewed: 03/03/2017 Elsevier Patient Education  [place].

## 2021-01-12 ENCOUNTER — Telehealth: Payer: Self-pay | Admitting: Surgery

## 2021-01-12 NOTE — Telephone Encounter (Signed)
Patient has been advised of Pre-Admission date/time, COVID Testing date and Surgery date.  Surgery Date: 02/06/21 Preadmission Testing Date: 01/25/21 (phone 8a-1p) Covid Testing Date: 02/05/21 @ 8:10 am, patient advised to go to the [facility]   Patient has been made aware to call [phone], between 1-3:00pm the day before surgery, to find out what time to arrive for surgery.

## 2021-01-25 ENCOUNTER — Other Ambulatory Visit: Payer: Self-pay

## 2021-01-25 ENCOUNTER — Encounter
Admission: RE | Admit: 2021-01-25 | Discharge: 2021-01-25 | Disposition: A | Discharge status: Home / Self Care | Payer: Medicare Other | Source: Ambulatory Visit | Attending: Surgery | Admitting: Surgery

## 2021-01-25 HISTORY — DX: Type 2 diabetes mellitus without complications: E11.9

## 2021-01-25 HISTORY — DX: Essential (primary) hypertension: I10

## 2021-01-25 HISTORY — DX: Personal history of other diseases of the digestive system: Z87.19

## 2021-01-25 HISTORY — DX: Dyspnea, unspecified: R06.00

## 2021-01-25 NOTE — Patient Instructions (Signed)
Your procedure is scheduled on:02-06-21 TUESDAY Report to the Registration Desk on the 1st floor of the [facility]-Then proceed to the 2nd floor Surgery Desk in the [place] To find out your arrival time, please call [phone] between 1PM - 3PM on:02-05-21 MONDAY  REMEMBER: Instructions that are not followed completely may result in serious medical risk, up to and including death; or upon the discretion of your surgeon and anesthesiologist your surgery may need to be rescheduled.  Do not eat food after midnight the night before surgery.  No gum chewing, lozengers or hard candies.  You may however, drink CLEAR liquids up to 2 hours before you are scheduled to arrive for your surgery. Do not drink anything within 2 hours of your scheduled arrival time.  Clear liquids include: - water  - apple juice without pulp - gatorade (not RED, PURPLE, OR BLUE) - black coffee or tea (Do NOT add milk or creamers to the coffee or tea) Do NOT drink anything that is not on this list.  TAKE THESE MEDICATIONS THE MORNING OF SURGERY WITH A SIP OF WATER: -Prilosec (Omeprazole)  -Stop your 81 mg Aspirin 7 days prior to surgery-Last dose will be on 01-29-21 Monday  One week prior to surgery: Stop Anti-inflammatories (NSAIDS) such as Advil, Aleve, Ibuprofen, Motrin, Naproxen, Naprosyn and Aspirin based products such as Excedrin, Goodys Powder, BC Powder.You may however, continue to take Tylenol if needed for pain up until the day of surgery.  Stop ANY OVER THE COUNTER supplements/vitamins 7 days prior to surgery-Last dose on 01-29-21 Monday   No Alcohol for 24 hours before or after surgery.  No Smoking including e-cigarettes for 24 hours prior to surgery.  No chewable tobacco products for at least 6 hours prior to surgery.  No nicotine patches on the day of surgery.  Do not use any "recreational" drugs for at least a week prior to your surgery.  Please be advised that the combination of cocaine and  anesthesia may have negative outcomes, up to and including death. If you test positive for cocaine, your surgery will be cancelled.  On the morning of surgery brush your teeth with toothpaste and water, you may rinse your mouth with mouthwash if you wish. Do not swallow any toothpaste or mouthwash.  Do not wear jewelry, make-up, hairpins, clips or nail polish.  Do not wear lotions, powders, or perfumes.   Do not shave body from the neck down 48 hours prior to surgery just in case you cut yourself which could leave a site for infection.  Also, freshly shaved skin may become irritated if using the CHG soap.  Contact lenses, hearing aids and dentures may not be worn into surgery.  Do not bring valuables to the hospital. [facility] is not responsible for any missing/lost belongings or valuables.   Use CHG Soap as directed on instruction sheet.  Notify your doctor if there is any change in your medical condition (cold, fever, infection).  Wear comfortable clothing (specific to your surgery type) to the hospital.  After surgery, you can help prevent lung complications by doing breathing exercises.  Take deep breaths and cough every 1-2 hours. Your doctor may order a device called an Incentive Spirometer to help you take deep breaths. When coughing or sneezing, hold a pillow firmly against your incision with both hands. This is called "splinting." Doing this helps protect your incision. It also decreases belly discomfort.  If you are being admitted to the hospital overnight, leave your  suitcase in the car. After surgery it may be brought to your room.  If you are being discharged the day of surgery, you will not be allowed to drive home. You will need a responsible adult (18 years or older) to drive you home and stay with you that night.   If you are taking public transportation, you will need to have a responsible adult (18 years or older) with you. Please confirm with your physician  that it is acceptable to use public transportation.   Please call the [facility] at [phone] if you have any questions about these instructions.  Surgery Visitation Policy:  Patients undergoing a surgery or procedure may have one family member or support person with them as long as that person is not COVID-19 positive or experiencing its symptoms.  That person may remain in the waiting area during the procedure.  Inpatient Visitation:    Visiting hours are 7 a.m. to 8 p.m. Inpatients will be allowed two visitors daily. The visitors may change each day during the patient's stay. No visitors under the age of 44. Any visitor under the age of 28 must be accompanied by an adult. The visitor must pass COVID-19 screenings, use hand sanitizer when entering and exiting the patient's room and wear a mask at all times, including in the patient's room. Patients must also wear a mask when staff or their visitor are in the room. Masking is required regardless of vaccination status.

## 2021-01-26 ENCOUNTER — Encounter
Admission: RE | Admit: 2021-01-26 | Discharge: 2021-01-26 | Disposition: A | Discharge status: Home / Self Care | Payer: Medicare Other | Source: Ambulatory Visit | Attending: Surgery | Admitting: Surgery

## 2021-01-26 DIAGNOSIS — I10 Essential (primary) hypertension: Secondary | ICD-10-CM | POA: Insufficient documentation

## 2021-01-26 DIAGNOSIS — Z01818 Encounter for other preprocedural examination: Secondary | ICD-10-CM | POA: Insufficient documentation

## 2021-01-26 LAB — BASIC METABOLIC PANEL
Anion gap: 6 (ref 5–15)
BUN: 15 mg/dL (ref 8–23)
CO2: 30 mmol/L (ref 22–32)
Calcium: 9.6 mg/dL (ref 8.9–10.3)
Chloride: 101 mmol/L (ref 98–111)
Creatinine, Ser: 0.77 mg/dL (ref 0.44–1.00)
GFR, Estimated: >60 mL/min (ref >60)
Glucose, Bld: 89 mg/dL (ref 70–99)
Potassium: 3 mmol/L — ABNORMAL LOW (ref 3.5–5.1)
Sodium: 137 mmol/L (ref 135–145)

## 2021-01-26 NOTE — Progress Notes (Signed)
  [facility] Perioperative Services: Pre-Admission/Anesthesia Testing  Abnormal Lab Notification   Date: 01/26/21  Name: Ruth Grant MRN:   [id number]  Re: Abnormal labs noted during PAT appointment   Provider(s) Notified: Jules Husbands, MD Notification mode: Routed and/or faxed via [place] LAB VALUE(S): Lab Results  Component Value Date   K 3.0 (L) 01/26/2021    Notes:  Patient is scheduled for a ROBOT ASSISTED [place] on 02/06/2021.  In review of patient's medication reconciliation, it is noted the patient is on daily thiazide diuretic (HCTZ 25 mg).  We will send result to primary attending surgeon for review and optimization.  Order placed for patient to have K+ level rechecked on the day of surgery to ensure optimization.    This is a Community education officer; no formal response is required.  Honor Loh, MSN, APRN, FNP-C, CEN [facility]  Peri-operative Services Nurse Practitioner Phone: [phone] Fax: [phone] 01/26/21 2:25 PM

## 2021-01-29 ENCOUNTER — Telehealth: Payer: Self-pay

## 2021-01-29 ENCOUNTER — Other Ambulatory Visit: Payer: Self-pay

## 2021-01-29 MED ORDER — POTASSIUM CHLORIDE CRYS ER 20 MEQ PO TBCR: 20.0000 meq | EXTENDED_RELEASE_TABLET | Freq: Two times a day (BID) | ORAL | 0 refills | Status: DC

## 2021-01-29 NOTE — Telephone Encounter (Signed)
Patient notified of low potassium and to pick up medication at the pharmacy.

## 2021-01-29 NOTE — Telephone Encounter (Signed)
Left message for patient to return call to office.   Low potassium -Potassium sent to pharmacy 20 meq for 10 BID.Per Dr.Pabon

## 2021-02-02 ENCOUNTER — Other Ambulatory Visit: Payer: Medicare Other

## 2021-02-05 ENCOUNTER — Other Ambulatory Visit: Payer: Self-pay

## 2021-02-05 ENCOUNTER — Other Ambulatory Visit
Admission: RE | Admit: 2021-02-05 | Discharge: 2021-02-05 | Disposition: A | Discharge status: Home / Self Care | Payer: Medicare Other | Source: Ambulatory Visit | Attending: Surgery | Admitting: Surgery

## 2021-02-05 DIAGNOSIS — Z20822 Contact with and (suspected) exposure to covid-19: Secondary | ICD-10-CM | POA: Insufficient documentation

## 2021-02-05 DIAGNOSIS — Z01812 Encounter for preprocedural laboratory examination: Secondary | ICD-10-CM | POA: Insufficient documentation

## 2021-02-05 LAB — SARS CORONAVIRUS 2 (TAT 6-24 HRS): SARS Coronavirus 2: NEGATIVE

## 2021-02-06 ENCOUNTER — Inpatient Hospital Stay: Payer: Medicare Other | Admitting: Urgent Care

## 2021-02-06 ENCOUNTER — Encounter
Admission: RE | Disposition: A | Discharge status: Home / Self Care | Payer: Self-pay | Source: Home / Self Care | Attending: Surgery

## 2021-02-06 ENCOUNTER — Inpatient Hospital Stay
Admission: RE | Admit: 2021-02-06 | Discharge: 2021-02-07 | DRG: 328 | Disposition: A | Discharge status: Home / Self Care | Payer: Medicare Other | Attending: Surgery | Admitting: Surgery

## 2021-02-06 ENCOUNTER — Encounter: Payer: Self-pay | Admitting: Surgery

## 2021-02-06 DIAGNOSIS — J309 Allergic rhinitis, unspecified: Secondary | ICD-10-CM | POA: Diagnosis present

## 2021-02-06 DIAGNOSIS — Z8249 Family history of ischemic heart disease and other diseases of the circulatory system: Secondary | ICD-10-CM

## 2021-02-06 DIAGNOSIS — M810 Age-related osteoporosis without current pathological fracture: Secondary | ICD-10-CM | POA: Diagnosis present

## 2021-02-06 DIAGNOSIS — Z833 Family history of diabetes mellitus: Secondary | ICD-10-CM

## 2021-02-06 DIAGNOSIS — Z83438 Family history of other disorder of lipoprotein metabolism and other lipidemia: Secondary | ICD-10-CM | POA: Diagnosis not present

## 2021-02-06 DIAGNOSIS — K449 Diaphragmatic hernia without obstruction or gangrene: Principal | ICD-10-CM | POA: Diagnosis present

## 2021-02-06 DIAGNOSIS — E119 Type 2 diabetes mellitus without complications: Secondary | ICD-10-CM | POA: Diagnosis present

## 2021-02-06 DIAGNOSIS — K219 Gastro-esophageal reflux disease without esophagitis: Secondary | ICD-10-CM | POA: Diagnosis present

## 2021-02-06 DIAGNOSIS — Z888 Allergy status to other drugs, medicaments and biological substances status: Secondary | ICD-10-CM

## 2021-02-06 DIAGNOSIS — Z825 Family history of asthma and other chronic lower respiratory diseases: Secondary | ICD-10-CM

## 2021-02-06 DIAGNOSIS — E785 Hyperlipidemia, unspecified: Secondary | ICD-10-CM | POA: Diagnosis present

## 2021-02-06 DIAGNOSIS — Z20822 Contact with and (suspected) exposure to covid-19: Secondary | ICD-10-CM | POA: Diagnosis present

## 2021-02-06 HISTORY — PX: XI ROBOTIC ASSISTED HIATAL HERNIA REPAIR: SHX6889

## 2021-02-06 LAB — CBC
HCT: 38.1 % (ref 36.0–46.0)
Hemoglobin: 12.9 g/dL (ref 12.0–15.0)
MCH: 29.9 pg (ref 26.0–34.0)
MCHC: 33.9 g/dL (ref 30.0–36.0)
MCV: 88.2 fL (ref 80.0–100.0)
Platelets: 245 10*3/uL (ref 150–400)
RBC: 4.32 MIL/uL (ref 3.87–5.11)
RDW: 12.5 % (ref 11.5–15.5)
WBC: 14.8 10*3/uL — ABNORMAL HIGH (ref 4.0–10.5)
nRBC: 0 % (ref 0.0–0.2)

## 2021-02-06 LAB — POCT I-STAT, CHEM 8
BUN: 17 mg/dL (ref 8–23)
Calcium, Ion: 1.31 mmol/L (ref 1.15–1.40)
Chloride: 104 mmol/L (ref 98–111)
Creatinine, Ser: 0.9 mg/dL (ref 0.44–1.00)
Glucose, Bld: 95 mg/dL (ref 70–99)
HCT: 37 % (ref 36.0–46.0)
Hemoglobin: 12.6 g/dL (ref 12.0–15.0)
Potassium: 3.7 mmol/L (ref 3.5–5.1)
Sodium: 141 mmol/L (ref 135–145)
TCO2: 26 mmol/L (ref 22–32)

## 2021-02-06 LAB — HIV ANTIBODY (ROUTINE TESTING W REFLEX): HIV Screen 4th Generation wRfx: NONREACTIVE

## 2021-02-06 LAB — CREATININE, SERUM
Creatinine, Ser: 0.97 mg/dL (ref 0.44–1.00)
GFR, Estimated: >60 mL/min (ref >60)

## 2021-02-06 LAB — GLUCOSE, CAPILLARY: Glucose-Capillary: 183 mg/dL — ABNORMAL HIGH (ref 70–99)

## 2021-02-06 SURGERY — REPAIR, HERNIA, HIATAL, ROBOT-ASSISTED
Anesthesia: General

## 2021-02-06 MED ORDER — ACETAMINOPHEN 500 MG PO TABS
1000.0000 mg | ORAL_TABLET | Freq: Four times a day (QID) | ORAL | Status: DC
  Administered 2021-02-06 – 2021-02-07 (×3): 1000 mg via ORAL
  Filled 2021-02-06 (×3): qty 2

## 2021-02-06 MED ORDER — EPHEDRINE SULFATE 50 MG/ML IJ SOLN
INTRAMUSCULAR | Status: DC | PRN
  Administered 2021-02-06 (×2): 10 mg via INTRAVENOUS

## 2021-02-06 MED ORDER — SODIUM CHLORIDE 0.9 % IV SOLN: INTRAVENOUS | Status: DC

## 2021-02-06 MED ORDER — CEFAZOLIN SODIUM-DEXTROSE 2-4 GM/100ML-% IV SOLN
2.0000 g | INTRAVENOUS | Status: AC
  Administered 2021-02-06: 2 g via INTRAVENOUS

## 2021-02-06 MED ORDER — LACTATED RINGERS IV SOLN: INTRAVENOUS | Status: DC

## 2021-02-06 MED ORDER — MIDAZOLAM HCL 2 MG/2ML IJ SOLN
INTRAMUSCULAR | Status: DC | PRN
  Administered 2021-02-06: 2 mg via INTRAVENOUS

## 2021-02-06 MED ORDER — BUPIVACAINE-EPINEPHRINE (PF) 0.25% -1:200000 IJ SOLN
INTRAMUSCULAR | Status: AC
  Filled 2021-02-06: qty 30

## 2021-02-06 MED ORDER — CHLORHEXIDINE GLUCONATE 0.12 % MT SOLN: 15.0000 mL | Freq: Once | OROMUCOSAL | Status: AC

## 2021-02-06 MED ORDER — ACETAMINOPHEN 500 MG PO TABS
ORAL_TABLET | ORAL | Status: AC
  Filled 2021-02-06: qty 2

## 2021-02-06 MED ORDER — VISTASEAL 10 ML SINGLE DOSE KIT
PACK | CUTANEOUS | Status: AC
  Filled 2021-02-06: qty 10

## 2021-02-06 MED ORDER — ORAL CARE MOUTH RINSE: 15.0000 mL | Freq: Once | OROMUCOSAL | Status: AC

## 2021-02-06 MED ORDER — DIPHENHYDRAMINE HCL 12.5 MG/5ML PO ELIX
12.5000 mg | ORAL_SOLUTION | Freq: Four times a day (QID) | ORAL | Status: DC | PRN
  Filled 2021-02-06: qty 5

## 2021-02-06 MED ORDER — GABAPENTIN 300 MG PO CAPS
ORAL_CAPSULE | ORAL | Status: AC
  Administered 2021-02-06: 300 mg via ORAL
  Filled 2021-02-06: qty 1

## 2021-02-06 MED ORDER — VISTASEAL 10 ML SINGLE DOSE KIT
PACK | CUTANEOUS | Status: DC | PRN
  Administered 2021-02-06: 10 mL via TOPICAL

## 2021-02-06 MED ORDER — FENTANYL CITRATE (PF) 100 MCG/2ML IJ SOLN
INTRAMUSCULAR | Status: AC
  Filled 2021-02-06: qty 2

## 2021-02-06 MED ORDER — OXYCODONE HCL 5 MG PO TABS
ORAL_TABLET | ORAL | Status: AC
  Filled 2021-02-06: qty 1

## 2021-02-06 MED ORDER — SERTRALINE HCL 50 MG PO TABS
50.0000 mg | ORAL_TABLET | Freq: Every day | ORAL | Status: DC
  Administered 2021-02-06: 50 mg via ORAL
  Filled 2021-02-06: qty 1

## 2021-02-06 MED ORDER — ROCURONIUM BROMIDE 100 MG/10ML IV SOLN
INTRAVENOUS | Status: DC | PRN
  Administered 2021-02-06: 50 mg via INTRAVENOUS
  Administered 2021-02-06: 10 mg via INTRAVENOUS

## 2021-02-06 MED ORDER — ONDANSETRON HCL 4 MG/2ML IJ SOLN
INTRAMUSCULAR | Status: DC | PRN
  Administered 2021-02-06: 4 mg via INTRAVENOUS

## 2021-02-06 MED ORDER — HYDROCHLOROTHIAZIDE 25 MG PO TABS
25.0000 mg | ORAL_TABLET | Freq: Every evening | ORAL | Status: DC
  Administered 2021-02-06: 25 mg via ORAL
  Filled 2021-02-06: qty 1

## 2021-02-06 MED ORDER — MELATONIN 5 MG PO TABS: 5.0000 mg | ORAL_TABLET | Freq: Every evening | ORAL | Status: DC | PRN

## 2021-02-06 MED ORDER — PROPOFOL 10 MG/ML IV BOLUS
INTRAVENOUS | Status: AC
  Filled 2021-02-06: qty 40

## 2021-02-06 MED ORDER — CEFAZOLIN SODIUM-DEXTROSE 2-4 GM/100ML-% IV SOLN
INTRAVENOUS | Status: AC
  Filled 2021-02-06: qty 100

## 2021-02-06 MED ORDER — ROCURONIUM BROMIDE 10 MG/ML (PF) SYRINGE
PREFILLED_SYRINGE | INTRAVENOUS | Status: AC
  Filled 2021-02-06: qty 10

## 2021-02-06 MED ORDER — CELECOXIB 200 MG PO CAPS
ORAL_CAPSULE | ORAL | Status: AC
  Administered 2021-02-06: 200 mg via ORAL
  Filled 2021-02-06: qty 1

## 2021-02-06 MED ORDER — BUPIVACAINE-EPINEPHRINE 0.25% -1:200000 IJ SOLN
INTRAMUSCULAR | Status: DC | PRN
  Administered 2021-02-06: 30 mL

## 2021-02-06 MED ORDER — BUPIVACAINE LIPOSOME 1.3 % IJ SUSP
INTRAMUSCULAR | Status: AC
  Filled 2021-02-06: qty 20

## 2021-02-06 MED ORDER — ASPIRIN 81 MG PO CHEW
81.0000 mg | CHEWABLE_TABLET | Freq: Every day | ORAL | Status: DC
  Administered 2021-02-07: 81 mg via ORAL
  Filled 2021-02-06: qty 1

## 2021-02-06 MED ORDER — LEVOCETIRIZINE DIHYDROCHLORIDE 5 MG PO TABS: 5.0000 mg | ORAL_TABLET | Freq: Every evening | ORAL | Status: DC

## 2021-02-06 MED ORDER — MORPHINE SULFATE (PF) 2 MG/ML IV SOLN: 2.0000 mg | INTRAVENOUS | Status: DC | PRN

## 2021-02-06 MED ORDER — MIDAZOLAM HCL 2 MG/2ML IJ SOLN
INTRAMUSCULAR | Status: AC
  Filled 2021-02-06: qty 2

## 2021-02-06 MED ORDER — CHLORHEXIDINE GLUCONATE CLOTH 2 % EX PADS: 6.0000 | MEDICATED_PAD | Freq: Once | CUTANEOUS | Status: DC

## 2021-02-06 MED ORDER — PHENYLEPHRINE HCL (PRESSORS) 10 MG/ML IV SOLN
INTRAVENOUS | Status: AC
  Filled 2021-02-06: qty 1

## 2021-02-06 MED ORDER — ONDANSETRON HCL 4 MG/2ML IJ SOLN
INTRAMUSCULAR | Status: AC
  Filled 2021-02-06: qty 2

## 2021-02-06 MED ORDER — LIDOCAINE HCL (CARDIAC) PF 100 MG/5ML IV SOSY
PREFILLED_SYRINGE | INTRAVENOUS | Status: DC | PRN
  Administered 2021-02-06: 100 mg via INTRAVENOUS

## 2021-02-06 MED ORDER — ACETAMINOPHEN 500 MG PO TABS
1000.0000 mg | ORAL_TABLET | ORAL | Status: AC
Start: 2021-02-06 — End: 2021-02-06

## 2021-02-06 MED ORDER — FENTANYL CITRATE (PF) 100 MCG/2ML IJ SOLN
25.0000 ug | INTRAMUSCULAR | Status: DC | PRN
  Administered 2021-02-06: 50 ug via INTRAVENOUS
  Administered 2021-02-06: 25 ug via INTRAVENOUS
  Administered 2021-02-06: 50 ug via INTRAVENOUS
  Administered 2021-02-06: 25 ug via INTRAVENOUS

## 2021-02-06 MED ORDER — BUPIVACAINE LIPOSOME 1.3 % IJ SUSP
INTRAMUSCULAR | Status: DC | PRN
  Administered 2021-02-06: 20 mL

## 2021-02-06 MED ORDER — PROCHLORPERAZINE EDISYLATE 10 MG/2ML IJ SOLN
5.0000 mg | Freq: Four times a day (QID) | INTRAMUSCULAR | Status: DC | PRN
  Filled 2021-02-06: qty 2

## 2021-02-06 MED ORDER — ASPIRIN 81 MG PO CHEW
81.0000 mg | CHEWABLE_TABLET | Freq: Every day | ORAL | Status: DC
  Filled 2021-02-06: qty 1

## 2021-02-06 MED ORDER — PROCHLORPERAZINE MALEATE 10 MG PO TABS
10.0000 mg | ORAL_TABLET | Freq: Four times a day (QID) | ORAL | Status: DC | PRN
  Filled 2021-02-06: qty 1

## 2021-02-06 MED ORDER — DEXAMETHASONE SODIUM PHOSPHATE 10 MG/ML IJ SOLN
INTRAMUSCULAR | Status: AC
  Filled 2021-02-06: qty 1

## 2021-02-06 MED ORDER — PROPOFOL 10 MG/ML IV BOLUS
INTRAVENOUS | Status: DC | PRN
  Administered 2021-02-06: 150 mg via INTRAVENOUS

## 2021-02-06 MED ORDER — CELECOXIB 200 MG PO CAPS: 200.0000 mg | ORAL_CAPSULE | ORAL | Status: AC

## 2021-02-06 MED ORDER — KETOROLAC TROMETHAMINE 15 MG/ML IJ SOLN
15.0000 mg | Freq: Four times a day (QID) | INTRAMUSCULAR | Status: DC
  Administered 2021-02-06 – 2021-02-07 (×3): 15 mg via INTRAVENOUS
  Filled 2021-02-06 (×3): qty 1

## 2021-02-06 MED ORDER — PHENYLEPHRINE HCL (PRESSORS) 10 MG/ML IV SOLN
INTRAVENOUS | Status: DC | PRN
  Administered 2021-02-06 (×2): 200 ug via INTRAVENOUS
  Administered 2021-02-06 (×4): 100 ug via INTRAVENOUS

## 2021-02-06 MED ORDER — ONDANSETRON HCL 4 MG/2ML IJ SOLN: 4.0000 mg | Freq: Four times a day (QID) | INTRAMUSCULAR | Status: DC | PRN

## 2021-02-06 MED ORDER — OXYCODONE HCL 5 MG PO TABS
5.0000 mg | ORAL_TABLET | ORAL | Status: DC | PRN
  Administered 2021-02-07 (×2): 5 mg via ORAL
  Filled 2021-02-06 (×2): qty 1

## 2021-02-06 MED ORDER — DEXAMETHASONE SODIUM PHOSPHATE 10 MG/ML IJ SOLN
INTRAMUSCULAR | Status: DC | PRN
  Administered 2021-02-06: 5 mg via INTRAVENOUS

## 2021-02-06 MED ORDER — HYDRALAZINE HCL 20 MG/ML IJ SOLN
10.0000 mg | INTRAMUSCULAR | Status: DC | PRN
  Filled 2021-02-06: qty 0.5

## 2021-02-06 MED ORDER — FENTANYL CITRATE (PF) 100 MCG/2ML IJ SOLN
INTRAMUSCULAR | Status: DC | PRN
  Administered 2021-02-06 (×2): 50 ug via INTRAVENOUS
  Administered 2021-02-06: 25 ug via INTRAVENOUS

## 2021-02-06 MED ORDER — CHLORHEXIDINE GLUCONATE 0.12 % MT SOLN
OROMUCOSAL | Status: AC
  Administered 2021-02-06: 15 mL via OROMUCOSAL
  Filled 2021-02-06: qty 15

## 2021-02-06 MED ORDER — GABAPENTIN 300 MG PO CAPS: 300.0000 mg | ORAL_CAPSULE | ORAL | Status: AC

## 2021-02-06 MED ORDER — LIDOCAINE HCL (PF) 2 % IJ SOLN
INTRAMUSCULAR | Status: AC
  Filled 2021-02-06: qty 5

## 2021-02-06 MED ORDER — OXYCODONE HCL 5 MG PO TABS
5.0000 mg | ORAL_TABLET | Freq: Once | ORAL | Status: AC | PRN
  Administered 2021-02-06: 5 mg via ORAL

## 2021-02-06 MED ORDER — OXYCODONE HCL 5 MG/5ML PO SOLN: 5.0000 mg | Freq: Once | ORAL | Status: AC | PRN

## 2021-02-06 MED ORDER — KETOROLAC TROMETHAMINE 15 MG/ML IJ SOLN
INTRAMUSCULAR | Status: AC
  Filled 2021-02-06: qty 1

## 2021-02-06 MED ORDER — ENOXAPARIN SODIUM 40 MG/0.4ML IJ SOSY
40.0000 mg | PREFILLED_SYRINGE | INTRAMUSCULAR | Status: DC
  Administered 2021-02-07: 40 mg via SUBCUTANEOUS
  Filled 2021-02-06: qty 0.4

## 2021-02-06 MED ORDER — ACETAMINOPHEN 500 MG PO TABS
ORAL_TABLET | ORAL | Status: AC
  Administered 2021-02-06: 1000 mg via ORAL
  Filled 2021-02-06: qty 2

## 2021-02-06 MED ORDER — DIPHENHYDRAMINE HCL 50 MG/ML IJ SOLN: 12.5000 mg | Freq: Four times a day (QID) | INTRAMUSCULAR | Status: DC | PRN

## 2021-02-06 MED ORDER — POTASSIUM CHLORIDE CRYS ER 20 MEQ PO TBCR
20.0000 meq | EXTENDED_RELEASE_TABLET | Freq: Two times a day (BID) | ORAL | Status: DC
  Administered 2021-02-06 – 2021-02-07 (×2): 20 meq via ORAL
  Filled 2021-02-06 (×2): qty 1

## 2021-02-06 MED ORDER — ONDANSETRON 4 MG PO TBDP: 4.0000 mg | ORAL_TABLET | Freq: Four times a day (QID) | ORAL | Status: DC | PRN

## 2021-02-06 MED ORDER — SUGAMMADEX SODIUM 200 MG/2ML IV SOLN
INTRAVENOUS | Status: DC | PRN
  Administered 2021-02-06: 200 mg via INTRAVENOUS

## 2021-02-06 MED ORDER — FLUTICASONE PROPIONATE 50 MCG/ACT NA SUSP
2.0000 | Freq: Every day | NASAL | Status: DC | PRN
  Filled 2021-02-06: qty 16

## 2021-02-06 MED ORDER — LORATADINE 10 MG PO TABS
10.0000 mg | ORAL_TABLET | Freq: Every day | ORAL | Status: DC
  Administered 2021-02-06: 10 mg via ORAL
  Filled 2021-02-06: qty 1

## 2021-02-06 SURGICAL SUPPLY — 61 items
APPLICATOR VISTASEAL 35 (MISCELLANEOUS) ×3 IMPLANT
CANISTER SUCT 1200ML W/VALVE (MISCELLANEOUS) IMPLANT
CANNULA REDUC XI 12-8 STAPL (CANNULA) ×1
CANNULA REDUC XI 12-8MM STAPL (CANNULA) ×1
CANNULA REDUCER 12-8 DVNC XI (CANNULA) ×1 IMPLANT
CHLORAPREP W/TINT 26 (MISCELLANEOUS) ×3 IMPLANT
COVER WAND RF STERILE (DRAPES) IMPLANT
DECANTER SPIKE VIAL GLASS SM (MISCELLANEOUS) ×3 IMPLANT
DEFOGGER SCOPE WARMER CLEARIFY (MISCELLANEOUS) ×3 IMPLANT
DERMABOND ADVANCED (GAUZE/BANDAGES/DRESSINGS) ×2
DERMABOND ADVANCED .7 DNX12 (GAUZE/BANDAGES/DRESSINGS) ×1 IMPLANT
DRAPE 3/4 80X56 (DRAPES) ×3 IMPLANT
DRAPE ARM DVNC X/XI (DISPOSABLE) ×4 IMPLANT
DRAPE COLUMN DVNC XI (DISPOSABLE) ×1 IMPLANT
DRAPE DA VINCI XI ARM (DISPOSABLE) ×8
DRAPE DA VINCI XI COLUMN (DISPOSABLE) ×2
ELECT CAUTERY BLADE 6.4 (BLADE) ×3 IMPLANT
ELECT REM PT RETURN 9FT ADLT (ELECTROSURGICAL) ×3
ELECTRODE REM PT RTRN 9FT ADLT (ELECTROSURGICAL) ×1 IMPLANT
GAUZE 4X4 16PLY ~~LOC~~+RFID DBL (SPONGE) IMPLANT
GLOVE SURG ENC MOIS LTX SZ7 (GLOVE) ×9 IMPLANT
GOWN STRL REUS W/ TWL LRG LVL3 (GOWN DISPOSABLE) ×4 IMPLANT
GOWN STRL REUS W/TWL LRG LVL3 (GOWN DISPOSABLE) ×8
GRASPER LAPSCPC 5X45 DSP (INSTRUMENTS) ×3 IMPLANT
IRRIGATION STRYKERFLOW (MISCELLANEOUS) ×1 IMPLANT
IRRIGATOR STRYKERFLOW (MISCELLANEOUS) ×3
IV NS 1000ML (IV SOLUTION) ×2
IV NS 1000ML BAXH (IV SOLUTION) ×1 IMPLANT
KIT PINK PAD W/HEAD ARE REST (MISCELLANEOUS) ×3
KIT PINK PAD W/HEAD ARM REST (MISCELLANEOUS) ×1 IMPLANT
KIT TURNOVER CYSTO (KITS) IMPLANT
LABEL OR SOLS (LABEL) ×3 IMPLANT
MANIFOLD NEPTUNE II (INSTRUMENTS) ×3 IMPLANT
MESH BIO-A 7X10 SYN MAT (Mesh General) ×3 IMPLANT
NEEDLE HYPO 22GX1.5 SAFETY (NEEDLE) ×3 IMPLANT
OBTURATOR OPTICAL STANDARD 8MM (TROCAR) ×2
OBTURATOR OPTICAL STND 8 DVNC (TROCAR) ×1
OBTURATOR OPTICALSTD 8 DVNC (TROCAR) ×1 IMPLANT
PACK LAP CHOLECYSTECTOMY (MISCELLANEOUS) ×3 IMPLANT
PENCIL ELECTRO HAND CTR (MISCELLANEOUS) ×3 IMPLANT
SEAL CANN UNIV 5-8 DVNC XI (MISCELLANEOUS) ×3 IMPLANT
SEAL XI 5MM-8MM UNIVERSAL (MISCELLANEOUS) ×6
SEALER VESSEL DA VINCI XI (MISCELLANEOUS) ×2
SEALER VESSEL EXT DVNC XI (MISCELLANEOUS) ×1 IMPLANT
SOLUTION ELECTROLUBE (MISCELLANEOUS) ×3 IMPLANT
SPONGE T-LAP 18X18 ~~LOC~~+RFID (SPONGE) ×3 IMPLANT
STAPLER CANNULA SEAL DVNC XI (STAPLE) ×1 IMPLANT
STAPLER CANNULA SEAL XI (STAPLE) ×2
SUT MNCRL 4-0 (SUTURE) ×2
SUT MNCRL 4-0 27XMFL (SUTURE) ×1
SUT SILK 2 0 SH (SUTURE) ×9 IMPLANT
SUT VICRYL 0 AB UR-6 (SUTURE) ×6 IMPLANT
SUT VLOC 90 S/L VL9 GS22 (SUTURE) ×3 IMPLANT
SUTURE MNCRL 4-0 27XMF (SUTURE) ×1 IMPLANT
SYR 20ML LL LF (SYRINGE) ×3 IMPLANT
SYR 30ML LL (SYRINGE) ×3 IMPLANT
TAPE TRANSPORE STRL 2 31045 (GAUZE/BANDAGES/DRESSINGS) ×3 IMPLANT
TRAY FOLEY SLVR 16FR LF STAT (SET/KITS/TRAYS/PACK) ×3 IMPLANT
TROCAR BALLN GELPORT 12X130M (ENDOMECHANICALS) ×3 IMPLANT
TROCAR XCEL NON-BLD 5MMX100MML (ENDOMECHANICALS) ×3 IMPLANT
TUBING EVAC SMOKE HEATED PNEUM (TUBING) ×3 IMPLANT

## 2021-02-06 NOTE — Anesthesia Postprocedure Evaluation (Signed)
Anesthesia Post Note  Patient: Ruth Grant  Procedure(s) Performed: XI ROBOTIC ASSISTED HIATAL HERNIA REPAIR, RNFA to asssit  Patient location during evaluation: PACU Anesthesia Type: General Level of consciousness: awake and alert Pain management: pain level controlled Vital Signs Assessment: post-procedure vital signs reviewed and stable Respiratory status: spontaneous breathing, nonlabored ventilation, respiratory function stable and patient connected to nasal cannula oxygen Cardiovascular status: blood pressure returned to baseline and stable Postop Assessment: no apparent nausea or vomiting Anesthetic complications: no   No notable events documented.   Last Vitals:  Vitals:   02/06/21 1100 02/06/21 1115  BP: 124/70 128/81  Pulse: 99 (!) 101  Resp: 12 15  Temp:    SpO2: 95% 96%    Last Pain:  Vitals:   02/06/21 1115  TempSrc:   PainSc: Asleep                 Precious Haws Dezarai Prew

## 2021-02-06 NOTE — Transfer of Care (Cosign Needed)
Immediate Anesthesia Transfer of Care Note  Patient: Ruth Grant  Procedure(s) Performed: XI ROBOTIC ASSISTED HIATAL HERNIA REPAIR, RNFA to asssit  Patient Location: PACU  Anesthesia Type:General  Level of Consciousness: drowsy  Airway & Oxygen Therapy: Patient Spontanous Breathing and Patient connected to face mask oxygen  Post-op Assessment: Report given to RN and Post -op Vital signs reviewed and stable  Post vital signs: Reviewed and stable  Last Vitals:  Vitals Value Taken Time  BP 135/64 02/06/21 0952  Temp    Pulse 100 02/06/21 0957  Resp 16 02/06/21 0957  SpO2 100 % 02/06/21 0957  Vitals shown include unvalidated device data.  Last Pain:  Vitals:   02/06/21 0619  TempSrc: Oral  PainSc: 0-No pain         Complications: No notable events documented.

## 2021-02-06 NOTE — Interval H&P Note (Signed)
History and Physical Interval Note:  02/06/2021 7:23 AM  Ruth Grant  has presented today for surgery, with the diagnosis of hiatal hernia.  The various methods of treatment have been discussed with the patient and family. After consideration of risks, benefits and other options for treatment, the patient has consented to  Procedure(s): XI ROBOTIC [place], RNFA to asssit (N/A) as a surgical intervention.  The patient's history has been reviewed, patient examined, no change in status, stable for surgery.  I have reviewed the patient's chart and labs.  Questions were answered to the patient's satisfaction.     [place]

## 2021-02-06 NOTE — Op Note (Addendum)
Robotic assisted laparoscopic Nissen fundoplication w repair of  Paraesophageal hernia with BioA Meash  Pre-operative Diagnosis: GERD, paraesophageal hernia  Post-operative Diagnosis: same  Procedure:  Robotic assisted laparoscopic Nissen fundoplication w repair of Paraesophageal hernia with BioA Meash   Surgeon: Caroleen Hamman, MD FACS  Assistant: Otho Ket PA-C. Required due to the complexity of the case the need for exposure and lack of first assist.  Anesthesia: Gen. with endotracheal tube  Findings: Type III paraesophageal hernia Loose wrap 360 degree over 48 FR Bougie   Estimated Blood Loss: 15cc              Complications: none   Procedure Details  The patient was seen again in the Holding Room. The benefits, complications, treatment options, and expected outcomes were discussed with the patient. The risks of bleeding, infection, recurrence of symptoms, failure to resolve symptoms,  esophageal damage, Dysphagia, bowel injury, any of which could require further surgery were reviewed with the patient. The likelihood of improving the patient's symptoms with return to their baseline status is good.  The patient and/or family concurred with the proposed plan, giving informed consent.  The patient was taken to Operating Room, identified  and the procedure verified.  A Time Out was held and the above information confirmed.  Prior to the induction of general anesthesia, antibiotic prophylaxis was administered. VTE prophylaxis was in place. General endotracheal anesthesia was then administered and tolerated well. After the induction, the abdomen was prepped with Chloraprep and draped in the sterile fashion. The patient was positioned in the supine position.  Cut down technique was used to enter the abdominal cavity and a Hasson trochar was placed after two vicryl stitches were anchored to the fascia. Pneumoperitoneum was then created with CO2 and tolerated well without any adverse  changes in the patient's vital signs.  Three 8-mm ports were placed under direct vision. All skin incisions  were infiltrated with a local anesthetic agent before making the incision and placing the trocars. An additional 5 mm regular laparoscopic port was placed to assist with retraction and exposure.   The patient was positioned  in reverse Trendelenburg, robot was brought to the surgical field and docked in the standard fashion.  We made sure all the instrumentation was kept indirect view at all times and that there were no collision between the arms. I scrubbed out and went to the console.  I used a attic arm to retract the liver, the vessel sealer on my right hand and a forced bipolar grasper on my left hand.  There is along the extra 5 mm port allow me ample exposure and the ability to perform meticulous dissection  We Started dividing the lesser omentum via the pars flaccida.  We Were able to dissect the lesser curvature of the stomach and  dissected the fundus free from the right and left crus.  We circumferentially dissected the GE junction.  The hernia sac was also completely reduced and we were able to bring the stomach into the intra-abdominal position.  Attention then was turned to the greater curvature where the short gastrics were divided with sealer device.  We were able to identify the left crus and again were able to make sure there was a good circumferential dissection and that the hernia sac was completely excised.  We did perform a good dissection within the mediastinum to allow a complete reduction of the sac and a to completely allow an intra-abdominal Nissen fundoplication.  2-0V lock suture was inserted  and the crus as well as the hernia was closed with a running suture we used two strips of BioA mesh on each side of the crus as a pledged to the repair.  We Asked anesthesia to place a 48 French bougie and this went easily.  We also observe trajectory of the bougie. 360 degree  Nissen fundoplication was created with multiple 2-0 silk sutures and we placed 3 stitches taking some of the esophagus within that bite.  The fundoplication measured approximately 3-1/2 cm and he was floppy. I was very happy with the way the fundoplication laid and the repair of the hernia.   Inspection of the  upper quadrant was performed. No bleeding, bile  Or esophageal injuries leaks, or bowel injuries were noted. Robotic instruments and robotic arms were undocked in the standard fashion. All the needles were removed under direct visualization.   I scrubbed back in.  Pneumoperitoneum was released.  The periumbilical port site was closed with interrumpted 0 Vicryl sutures. 4-0 subcuticular Monocryl was used to close the skin. Liposomal marcaine was injected to all the incisions sites.  Dermabond was  applied.  The patient was then extubated and brought to the recovery room in stable condition. Sponge, lap, and needle counts were correct at closure and at the conclusion of the case.               Caroleen Hamman, MD, FACS

## 2021-02-06 NOTE — Progress Notes (Signed)
SENT MESSAGE TO PATIENT FAMILY, PATIENT IS RESTING WELL AND WAITING FOR A ROOM ASSIGNMENT

## 2021-02-06 NOTE — Discharge Instructions (Addendum)
In addition to included general post-operative instructions,  Diet: Recommend following Nissen Diet recommendations x4 weeks; multiple handouts provided  Activity: No heavy lifting >20 pounds (children, pets, laundry, garbage) or strenuous activity for 4 weeks, but light activity and walking are encouraged. Do not drive or drink alcohol if taking narcotic pain medications or having pain that might distract from driving.  Wound care: 2 days after surgery (06/30), you may shower/get incision wet with soapy water and pat dry (do not rub incisions), but no baths or submerging incision underwater until follow-up.   Medications: Resume all home medications. For mild to moderate pain: acetaminophen (Tylenol) or ibuprofen/naproxen (if no kidney disease). Combining Tylenol with alcohol can substantially increase your risk of causing liver disease. Narcotic pain medications, if prescribed, can be used for severe pain, though may cause nausea, constipation, and drowsiness. Do not combine Tylenol and Percocet (or similar) within a 6 hour period as Percocet (and similar) contain(s) Tylenol. If you do not need the narcotic pain medication, you do not need to fill the prescription.  Call office [phone] / [phone]) at any time if any questions, worsening pain, fevers/chills, bleeding, drainage from incision site, or other concerns.

## 2021-02-06 NOTE — Anesthesia Procedure Notes (Cosign Needed Addendum)
Procedure Name: Intubation Date/Time: 02/06/2021 7:41 AM Performed by: Andria Frames, MD Pre-anesthesia Checklist: Patient identified, Emergency Drugs available, Suction available and Patient being monitored Patient Re-evaluated:Patient Re-evaluated prior to induction Oxygen Delivery Method: Circle system utilized Preoxygenation: Pre-oxygenation with 100% oxygen Induction Type: IV induction Ventilation: Oral airway inserted - appropriate to patient size and Mask ventilation without difficulty Laryngoscope Size: Mac and 3 Grade View: Grade II Tube type: Oral Tube size: 6.5 mm Number of attempts: 1 Airway Equipment and Method: Stylet and Oral airway Placement Confirmation: ETT inserted through vocal cords under direct vision, positive ETCO2 and breath sounds checked- equal and bilateral Secured at: 22 cm Tube secured with: Tape Dental Injury: Teeth and Oropharynx as per pre-operative assessment

## 2021-02-06 NOTE — Anesthesia Preprocedure Evaluation (Signed)
Anesthesia Evaluation  Patient identified by MRN, date of birth, ID band Patient awake    Reviewed: Allergy & Precautions, NPO status , Patient's Chart, lab work & pertinent test results  History of Anesthesia Complications Negative for: history of anesthetic complications  Airway Mallampati: III  TM Distance: <3 FB Neck ROM: limited    Dental  (+) Chipped, Poor Dentition   Pulmonary neg shortness of breath, asthma , COPD,    Pulmonary exam normal        Cardiovascular Exercise Tolerance: Good hypertension, (-) angina(-) Past MI and (-) DOE Normal cardiovascular exam     Neuro/Psych PSYCHIATRIC DISORDERS  Neuromuscular disease    GI/Hepatic Neg liver ROS, hiatal hernia, GERD  Medicated and Controlled,  Endo/Other  diabetes, Type 2  Renal/GU      Musculoskeletal   Abdominal   Peds  Hematology negative hematology ROS (+)   Anesthesia Other Findings Past Medical History: No date: Allergic rhinitis No date: Diabetes mellitus without complication (HCC) No date: Dyspnea No date: Esophageal reflux No date: Herpes zoster No date: History of hiatal hernia No date: Hyperlipidemia No date: Hypertension No date: Neuralgia No date: Osteoporosis No date: Postmenopausal state  Past Surgical History: No date: CHOLECYSTECTOMY 05/31/2016: COLONOSCOPY WITH PROPOFOL; N/A     Comment:  Procedure: COLONOSCOPY WITH PROPOFOL;  Surgeon: Manya Silvas, MD;  Location: [facility] ENDOSCOPY;  Service:               Endoscopy;  Laterality: N/A; 11/28/2020: ESOPHAGOGASTRODUODENOSCOPY (EGD) WITH PROPOFOL; N/A     Comment:  Procedure: ESOPHAGOGASTRODUODENOSCOPY (EGD) WITH               PROPOFOL;  Surgeon: Lucilla Lame, MD;  Location: [facility]               ENDOSCOPY;  Service: Endoscopy;  Laterality: N/A; No date: TUBAL LIGATION  BMI    Body Mass Index: 29.26 kg/m      Reproductive/Obstetrics negative OB ROS                              Anesthesia Physical Anesthesia Plan  ASA: 3  Anesthesia Plan: General ETT   Post-op Pain Management:    Induction: Intravenous  PONV Risk Score and Plan: Ondansetron, Dexamethasone, Midazolam and Treatment may vary due to age or medical condition  Airway Management Planned: Oral ETT  Additional Equipment:   Intra-op Plan:   Post-operative Plan: Extubation in OR  Informed Consent: I have reviewed the patients History and Physical, chart, labs and discussed the procedure including the risks, benefits and alternatives for the proposed anesthesia with the patient or authorized representative who has indicated his/her understanding and acceptance.     Dental Advisory Given  Plan Discussed with: Anesthesiologist, CRNA and Surgeon  Anesthesia Plan Comments: (Patient consented for risks of anesthesia including but not limited to:  - adverse reactions to medications - damage to eyes, teeth, lips or other oral mucosa - nerve damage due to positioning  - sore throat or hoarseness - Damage to heart, brain, nerves, lungs, other parts of body or loss of life  Patient voiced understanding.)        Anesthesia Quick Evaluation

## 2021-02-07 ENCOUNTER — Encounter: Payer: Self-pay | Admitting: Surgery

## 2021-02-07 MED ORDER — OXYCODONE HCL 5 MG PO TABS: 5.0000 mg | ORAL_TABLET | ORAL | 0 refills | Status: DC | PRN

## 2021-02-07 NOTE — Discharge Summary (Signed)
[facility] SURGICAL DISCHARGE SUMMARY  Patient ID: Ruth Grant MRN: [id number] DOB/AGE: 10-09-1950 70 y.o.  Admit date: 02/06/2021 Discharge date: 02/07/2021  Discharge Diagnoses Patient Active Problem List   Diagnosis Date Noted   Hiatal hernia 02/06/2021   Gastroesophageal reflux disease without esophagitis     Consultants None  Procedures 02/06/2021:  Robotic assisted laparoscopic Nissen fundoplication w repair of  Paraesophageal hernia with BioA Meash  HPI: Ruth Grant is a 70 y.o. 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   Hospital Course: Informed consent was obtained and documented, and patient underwent uneventful robotic assisted laparoscopic paraesophageal hernia repair and Nissen Fundoplication (Dr Dahlia Byes, [id number]).  Post-operatively, patient did well and advancement of patient's diet and ambulation were well-tolerated. The remainder of patient's hospital course was essentially unremarkable, and discharge planning was initiated accordingly with patient safely able to be discharged home with appropriate discharge instructions, pain control, and outpatient follow-up after all of her questions were answered to her expressed satisfaction.   Discharge Condition: Good   Physical Examination:  Constitutional: Well appearing female, NAD Pulmonary: Normal effort, no respiratory distress Gastrointestinal: Soft, non-tender, non-distended, no rebound.guarding Skin: Laparoscopic incisions are CDI with dermabond, no erythema or drainage    Allergies as of 02/07/2021       Reactions   Ace Inhibitors Cough        Medication List     STOP taking these medications    omeprazole 40 MG capsule Commonly known as: PRILOSEC       TAKE these medications    aspirin 81 MG tablet Take 81 mg by mouth daily.   cholecalciferol 1000 units  tablet Commonly known as: VITAMIN D Take 1,000 Units by mouth daily.   fluticasone 50 MCG/ACT nasal spray Commonly known as: FLONASE Place 2 sprays into both nostrils daily as needed for allergies.   hydrochlorothiazide 25 MG tablet Commonly known as: HYDRODIURIL Take 25 mg by mouth every evening.   levocetirizine 5 MG tablet Commonly known as: XYZAL Take 5 mg by mouth every evening.   melatonin 5 MG Tabs Take 5 mg by mouth at bedtime as needed (sleep).   oxyCODONE 5 MG immediate release tablet Commonly known as: Oxy IR/ROXICODONE Take 1 tablet (5 mg total) by mouth every 4 (four) hours as needed for severe pain or breakthrough pain.   potassium chloride SA 20 MEQ tablet Commonly known as: KLOR-CON Take 1 tablet (20 mEq total) by mouth 2 (two) times daily.   raloxifene 60 MG tablet Commonly known as: EVISTA Take 60 mg by mouth daily.   sertraline 50 MG tablet Commonly known as: ZOLOFT Take 50 mg by mouth at bedtime.   simvastatin 40 MG tablet Commonly known as: ZOCOR Take 40 mg by mouth at bedtime.   VITAMIN B-12 PO Take 1 tablet by mouth every other day.          Follow-up Information     Pabon, [place] F, MD. Schedule an appointment as soon as possible for a visit in 2 week(s).   Specialty: General Surgery Why: s/p laparoscopic Nissen Fundoplication Contact information: [address] [place] [address] [phone]                  Time spent on discharge management including discussion of hospital course, clinical condition, outpatient instructions, prescriptions, and follow up with the patient and members of  the medical team: >30 minutes  -- Edison Simon , PA-C [facility]  02/07/2021, 8:14 AM [phone] M-F: 7am - 4pm

## 2021-02-21 ENCOUNTER — Encounter: Payer: Self-pay | Admitting: Surgery

## 2021-02-21 ENCOUNTER — Other Ambulatory Visit: Payer: Self-pay

## 2021-02-21 ENCOUNTER — Ambulatory Visit (INDEPENDENT_AMBULATORY_CARE_PROVIDER_SITE_OTHER): Payer: Medicare Other | Admitting: Surgery

## 2021-02-21 VITALS — BP 129/84 | HR 86 | Temp 98.7°F | Resp 16 | Ht 62.0 in | Wt 151.0 lb

## 2021-02-21 DIAGNOSIS — Z09 Encounter for follow-up examination after completed treatment for conditions other than malignant neoplasm: Secondary | ICD-10-CM

## 2021-02-21 NOTE — Patient Instructions (Signed)
If you have any concerns or questions, please feel free to call our office. See follow up appointment below. 

## 2021-02-23 NOTE — Progress Notes (Signed)
Ruth Grant is status post robotic hiatal hernia repair couple weeks ago.  This was uneventful.  She is doing very well and she is tolerating soft diet.  No fevers no chills no dysphagia.  She noticed that her cough and respiratory condition has dramatically improved.   PE: NAD Chest: CTA Abd: Soft nontender incisions healing well without evidence of infection or peritonitis.   A/P doing very well after robotic hiatal hernia repair.  No complications.  We will continue routine Nissen diet.  We will see her back in a couple months.

## 2021-02-26 ENCOUNTER — Encounter: Payer: Medicare Other | Admitting: Surgery

## 2021-03-19 ENCOUNTER — Telehealth: Payer: Self-pay | Admitting: Surgery

## 2021-03-19 NOTE — Telephone Encounter (Signed)
Patient is calling and said she is still having some coughing going on, said when she coughs her side hurts. Pleas call patient and advise.

## 2021-03-19 NOTE — Telephone Encounter (Signed)
Hiatal hernia repair-has cough-no difficulty swallowing-still eating soft food diet-sometimes after meals she does feel it burning-she has coughed several times during our conversation and it sounds like a wet cough-she said she does cough up phlem sometimes- I spoke with Dr.Pabon and she needs to contact her PCP or pulmonologist-does not think the cough is related to her Hiatal hernia repair. Patient will call pulmonologist.

## 2021-05-09 ENCOUNTER — Encounter: Payer: Self-pay | Admitting: Surgery

## 2021-05-09 ENCOUNTER — Other Ambulatory Visit: Payer: Self-pay

## 2021-05-09 ENCOUNTER — Ambulatory Visit (INDEPENDENT_AMBULATORY_CARE_PROVIDER_SITE_OTHER): Payer: Medicare Other | Admitting: Surgery

## 2021-05-09 ENCOUNTER — Encounter: Payer: Medicare Other | Admitting: Surgery

## 2021-05-09 VITALS — BP 134/89 | HR 85 | Temp 98.0°F | Ht 62.0 in | Wt 138.0 lb

## 2021-05-09 DIAGNOSIS — K449 Diaphragmatic hernia without obstruction or gangrene: Secondary | ICD-10-CM | POA: Diagnosis not present

## 2021-05-09 NOTE — Patient Instructions (Signed)
You should take a fiber supplement every day. You need to be sure you are drinking plenty of fluids. Your urine should be pale yellow to clear.  Continue to eat small frequent meals. OK to advance your diet slowly.   Follow-up with our office as needed.  Please call and ask to speak with a nurse if you develop questions or concerns.

## 2021-05-15 NOTE — Progress Notes (Signed)
Outpatient Surgical Follow Up  05/15/2021  Ruth Grant is an 70 y.o. female.   Chief Complaint  Patient presents with   Routine Post Op    HPI: Ruth Grant is a 70 year old female a little bit over 3 months after a robotic paraesophageal hernia repair.  She is doing well she denies any dysphagia denies any reflux pulmonary symptoms significantly improved  Past Medical History:  Diagnosis Date   Allergic rhinitis    Diabetes mellitus without complication (HCC)    Dyspnea    Esophageal reflux    Herpes zoster    History of hiatal hernia    Hyperlipidemia    Hypertension    Neuralgia    Osteoporosis    Postmenopausal state     Past Surgical History:  Procedure Laterality Date   CHOLECYSTECTOMY     COLONOSCOPY WITH PROPOFOL N/A 05/31/2016   Procedure: COLONOSCOPY WITH PROPOFOL;  Surgeon: Manya Silvas, MD;  Location: [facility];  Service: Endoscopy;  Laterality: N/A;   ESOPHAGOGASTRODUODENOSCOPY (EGD) WITH PROPOFOL N/A 11/28/2020   Procedure: ESOPHAGOGASTRODUODENOSCOPY (EGD) WITH PROPOFOL;  Surgeon: Lucilla Lame, MD;  Location: [facility] ENDOSCOPY;  Service: Endoscopy;  Laterality: N/A;   TUBAL LIGATION     XI ROBOTIC ASSISTED HIATAL HERNIA REPAIR N/A 02/06/2021   Procedure: XI ROBOTIC ASSISTED HIATAL HERNIA REPAIR, RNFA to asssit;  Surgeon: Jules Husbands, MD;  Location: [facility];  Service: General;  Laterality: N/A;    Family History  Problem Relation Age of Onset   Breast cancer Sister 73   Coronary artery disease Mother    Hypertension Mother    Hyperlipidemia Mother    Asthma Sister    Breast cancer Sister 85   Diabetes Sister    Breast cancer Sister 56   Emphysema Father        smoked   Emphysema Brother        smoked   Emphysema Sister        smoked   Emphysema Sister        never a smoker    Social History:  reports that she has never smoked. She has never used smokeless tobacco. She reports current alcohol use. She reports that she does not use  drugs.  Allergies:  Allergies  Allergen Reactions   Ace Inhibitors Cough    Medications reviewed.    ROS Full ROS performed and is otherwise negative other than what is stated in HPI   BP 134/89   Pulse 85   Temp 98 F (36.7 C)   Ht 5\' 2"  (1.575 m)   Wt 138 lb (62.6 kg)   LMP  (LMP Unknown)   SpO2 96%   BMI 25.24 kg/m   Physical Exam Vitals and nursing note reviewed. Exam conducted with a chaperone present.  Constitutional:      General: She is not in acute distress.    Appearance: Normal appearance.  Pulmonary:     Effort: Pulmonary effort is normal.     Breath sounds: No stridor. No rhonchi.  Abdominal:     General: Abdomen is flat. There is no distension.     Palpations: Abdomen is soft. There is no mass.     Tenderness: There is no abdominal tenderness. There is no guarding or rebound.     Hernia: No hernia is present.     Comments: Robotic scars healing well.  Musculoskeletal:     Cervical back: Normal range of motion and neck supple. No rigidity.  Skin:  Capillary Refill: Capillary refill takes less than 2 seconds.  Neurological:     General: No focal deficit present.     Mental Status: She is alert and oriented to person, place, and time.  Psychiatric:        Mood and Affect: Mood normal.        Behavior: Behavior normal.        Thought Content: Thought content normal.        Judgment: Judgment normal.        Assessment/Plan: Ruth Grant is a 70 year old female 3 months out after robotic Nissen and paraesophageal hernia repair.  She is doing very well she does have some nonspecific lower GI issues that we will continue to address with her GI doctor.  From foregut perspective she has no reflux and her pulmonary symptoms have significantly improved.  We will follow her up on a as needed basis.  Greater than 50% of the 20 minutes  visit was spent in counseling/coordination of care   Caroleen Hamman, MD [facility] Surgeon

## 2021-06-04 ENCOUNTER — Encounter: Payer: Medicare Other | Admitting: Surgery

## 2021-06-18 ENCOUNTER — Other Ambulatory Visit: Payer: Self-pay | Admitting: Internal Medicine

## 2021-06-18 DIAGNOSIS — Z1231 Encounter for screening mammogram for malignant neoplasm of breast: Secondary | ICD-10-CM

## 2021-06-22 ENCOUNTER — Other Ambulatory Visit: Payer: Self-pay

## 2021-06-22 ENCOUNTER — Ambulatory Visit
Admission: RE | Admit: 2021-06-22 | Discharge: 2021-06-22 | Disposition: A | Discharge status: Home / Self Care | Payer: Medicare Other | Source: Ambulatory Visit | Attending: Internal Medicine | Admitting: Internal Medicine

## 2021-06-22 DIAGNOSIS — Z1231 Encounter for screening mammogram for malignant neoplasm of breast: Secondary | ICD-10-CM | POA: Diagnosis present

## 2021-10-25 ENCOUNTER — Encounter (HOSPITAL_COMMUNITY): Payer: Self-pay | Admitting: Radiology

## 2022-04-20 IMAGING — MG MM DIGITAL SCREENING BILAT W/ TOMO AND CAD
8 series · 9 of 24 positions shown · non-contrast
Comparison: Previous exam(s).

CLINICAL DATA: Screening.

EXAM:
DIGITAL SCREENING BILATERAL MAMMOGRAM WITH TOMOSYNTHESIS AND CAD
TECHNIQUE: Bilateral screening digital craniocaudal and mediolateral oblique
mammograms were obtained. Bilateral screening digital breast
tomosynthesis was performed. The images were evaluated with
computer-aided detection.

[R CC synth-2D]
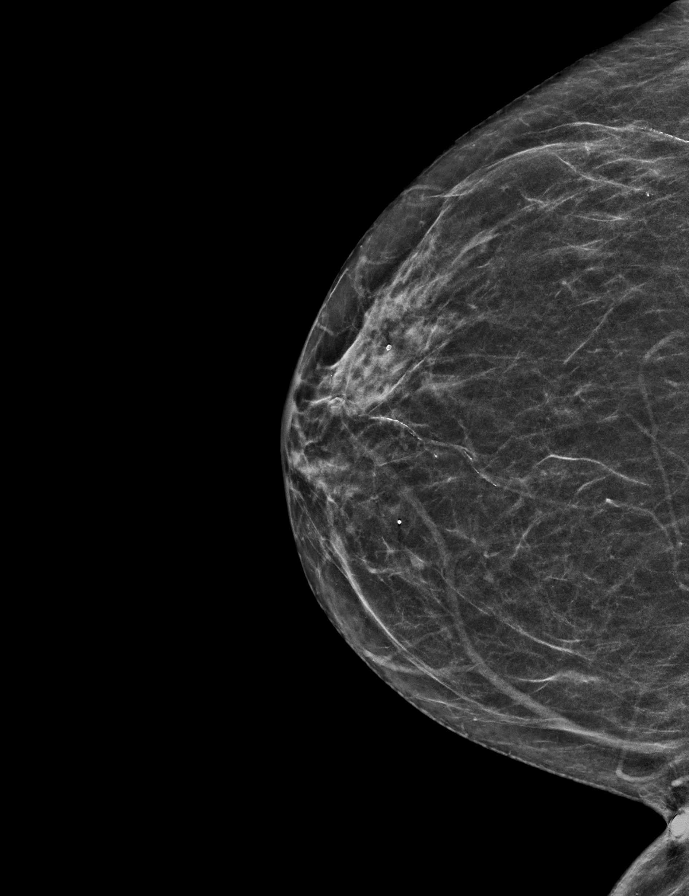

[L CC synth-2D]
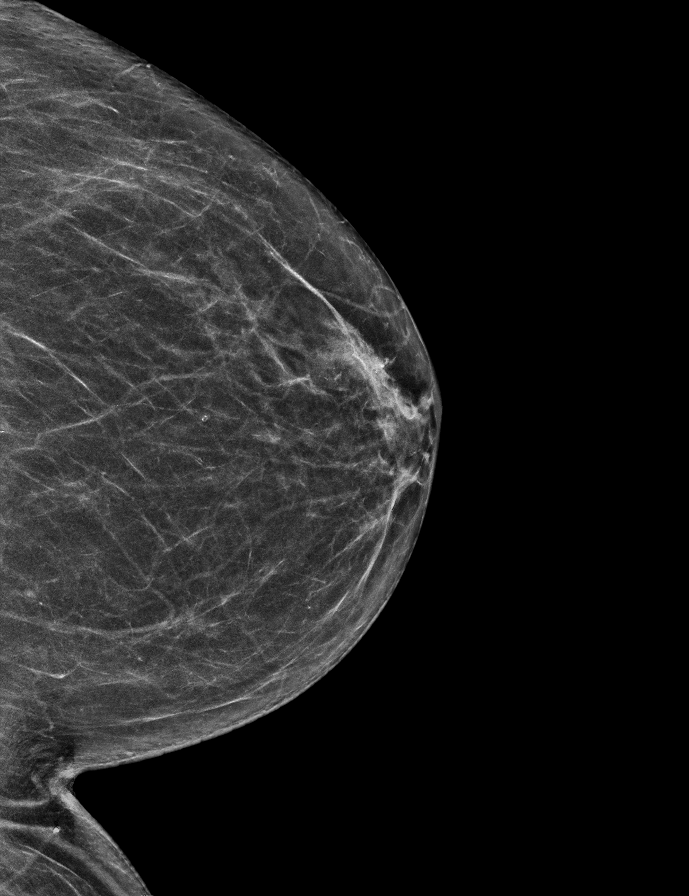

[L MLO synth-2D]
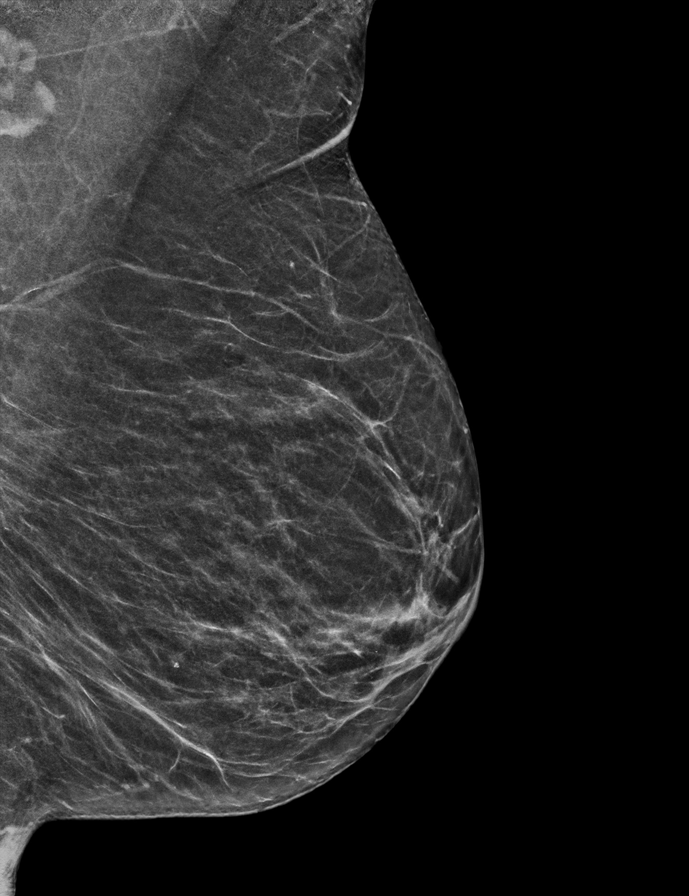

[R MLO synth-2D]
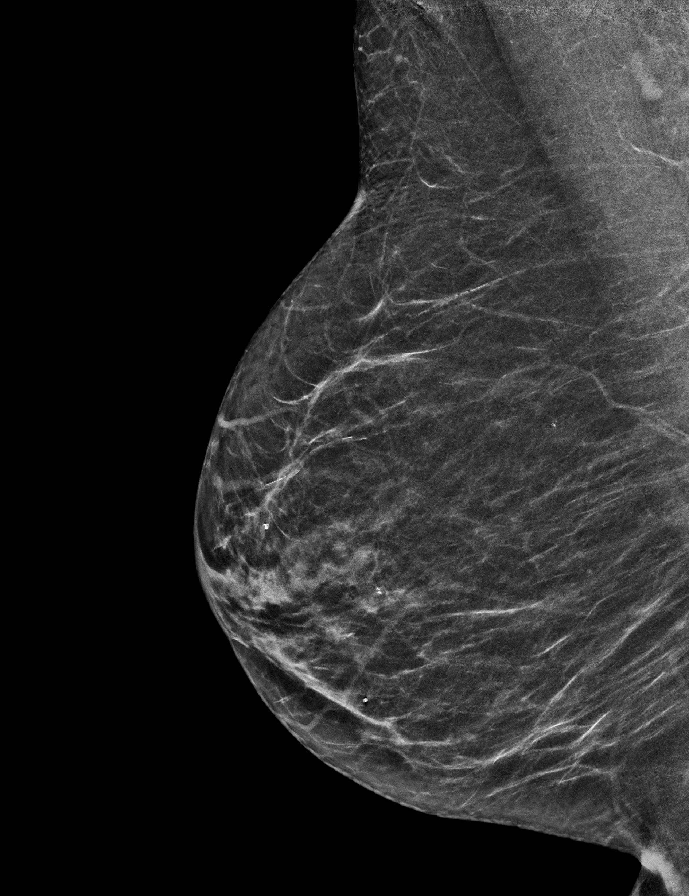

[R CC tomo · 2 of 46 frames shown]
[frame 15/46]
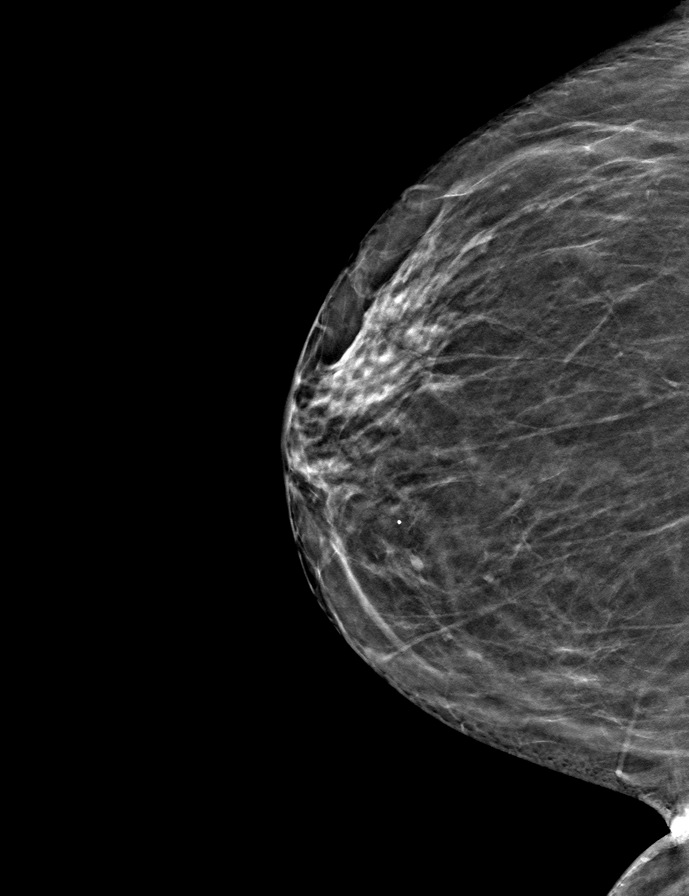
[frame 23/46]
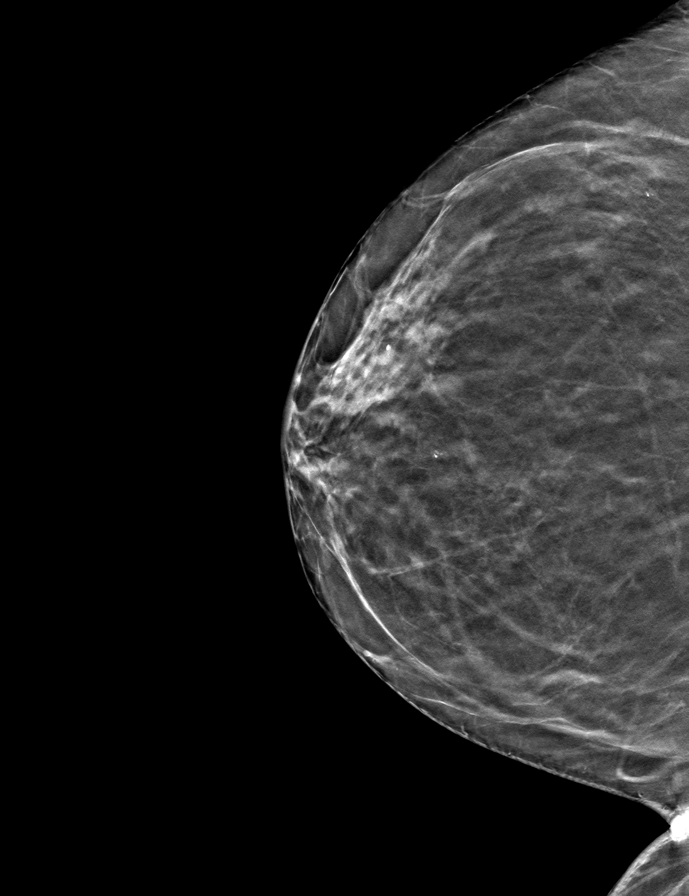

[R MLO tomo · tomo slice 25/50.0]
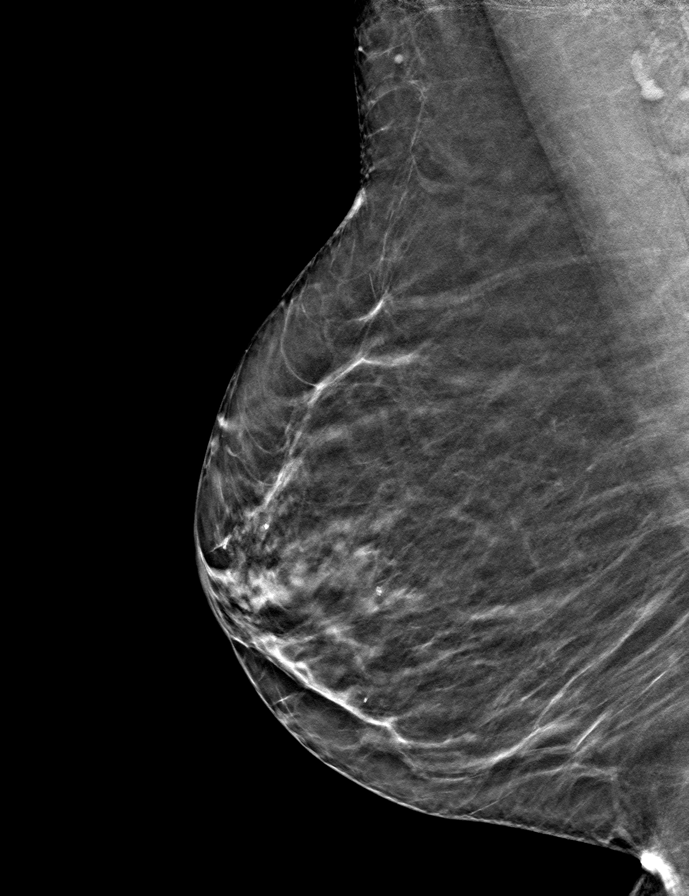

[L CC tomo · tomo slice 27/53.0]
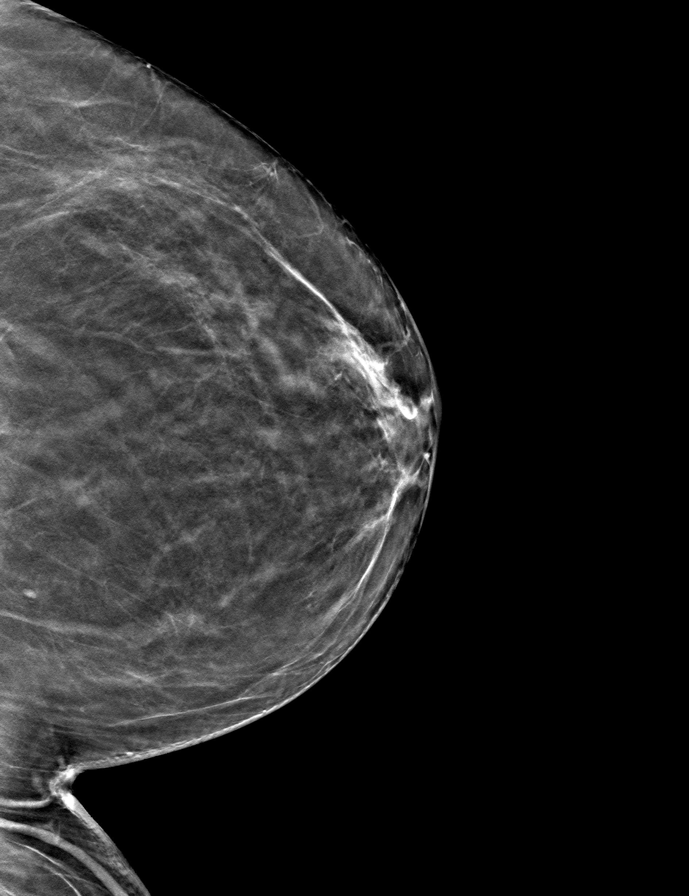

[L MLO tomo · tomo slice 27/52.0]
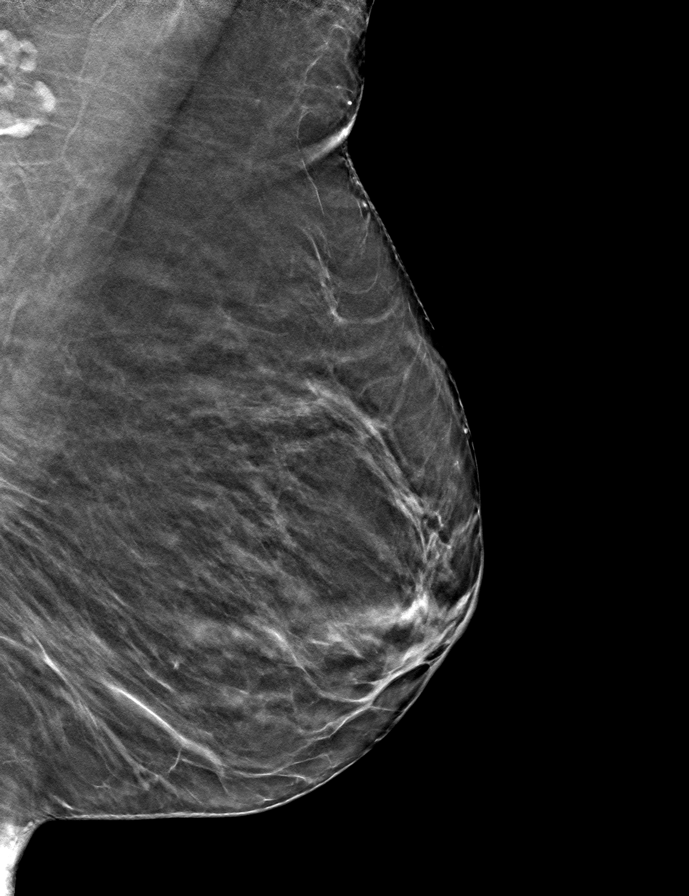

[9 of 24 positions shown; findings below may reference images not displayed]

ACR Breast Density Category b: There are scattered areas of
fibroglandular density.
FINDINGS: There are no findings suspicious for malignancy.
IMPRESSION: No mammographic evidence of malignancy. A result letter of this
screening mammogram will be mailed directly to the patient.

RECOMMENDATION:
Screening mammogram in one year. (Code:51-O-LD2)

BI-RADS CATEGORY  1: Negative.

## 2022-07-02 ENCOUNTER — Other Ambulatory Visit: Payer: Self-pay | Admitting: Internal Medicine

## 2022-07-02 DIAGNOSIS — Z1231 Encounter for screening mammogram for malignant neoplasm of breast: Secondary | ICD-10-CM

## 2022-07-24 ENCOUNTER — Ambulatory Visit
Admission: RE | Admit: 2022-07-24 | Discharge: 2022-07-24 | Disposition: A | Discharge status: Home / Self Care | Payer: Medicare Other | Source: Ambulatory Visit | Attending: Internal Medicine | Admitting: Internal Medicine

## 2022-07-24 DIAGNOSIS — Z1231 Encounter for screening mammogram for malignant neoplasm of breast: Secondary | ICD-10-CM | POA: Insufficient documentation

## 2022-12-17 ENCOUNTER — Other Ambulatory Visit: Payer: Self-pay | Admitting: Internal Medicine

## 2022-12-17 DIAGNOSIS — I6523 Occlusion and stenosis of bilateral carotid arteries: Secondary | ICD-10-CM

## 2022-12-17 DIAGNOSIS — G459 Transient cerebral ischemic attack, unspecified: Secondary | ICD-10-CM

## 2022-12-17 DIAGNOSIS — H539 Unspecified visual disturbance: Secondary | ICD-10-CM

## 2023-01-02 ENCOUNTER — Ambulatory Visit
Admission: RE | Admit: 2023-01-02 | Discharge: 2023-01-02 | Disposition: A | Discharge status: Home / Self Care | Payer: Medicare Other | Source: Ambulatory Visit | Attending: Internal Medicine | Admitting: Internal Medicine

## 2023-01-02 DIAGNOSIS — G459 Transient cerebral ischemic attack, unspecified: Secondary | ICD-10-CM | POA: Diagnosis present

## 2023-01-02 DIAGNOSIS — H539 Unspecified visual disturbance: Secondary | ICD-10-CM | POA: Insufficient documentation

## 2023-01-02 DIAGNOSIS — I6523 Occlusion and stenosis of bilateral carotid arteries: Secondary | ICD-10-CM | POA: Diagnosis present

## 2023-01-02 LAB — POCT I-STAT CREATININE: Creatinine, Ser: 1 mg/dL (ref 0.44–1.00)

## 2023-01-02 MED ORDER — IOHEXOL 350 MG/ML SOLN
75.0000 mL | Freq: Once | INTRAVENOUS | Status: AC | PRN
  Administered 2023-01-02: 75 mL via INTRAVENOUS

## 2023-07-16 ENCOUNTER — Other Ambulatory Visit: Payer: Self-pay | Admitting: Internal Medicine

## 2023-07-16 DIAGNOSIS — Z1231 Encounter for screening mammogram for malignant neoplasm of breast: Secondary | ICD-10-CM

## 2023-08-07 ENCOUNTER — Ambulatory Visit
Admission: RE | Admit: 2023-08-07 | Discharge: 2023-08-07 | Disposition: A | Discharge status: Home / Self Care | Payer: Medicare Other | Source: Ambulatory Visit | Attending: Internal Medicine | Admitting: Internal Medicine

## 2023-08-07 DIAGNOSIS — Z1231 Encounter for screening mammogram for malignant neoplasm of breast: Secondary | ICD-10-CM | POA: Insufficient documentation

## 2024-07-28 ENCOUNTER — Encounter: Payer: Self-pay | Admitting: Internal Medicine

## 2024-08-06 ENCOUNTER — Other Ambulatory Visit: Payer: Self-pay | Admitting: Internal Medicine

## 2024-08-06 DIAGNOSIS — Z1231 Encounter for screening mammogram for malignant neoplasm of breast: Secondary | ICD-10-CM

## 2024-08-31 ENCOUNTER — Encounter

## 2024-09-17 ENCOUNTER — Ambulatory Visit: Admission: RE | Admit: 2024-09-17 | Source: Ambulatory Visit

## 2024-09-17 DIAGNOSIS — Z1231 Encounter for screening mammogram for malignant neoplasm of breast: Secondary | ICD-10-CM
